# Patient Record
Sex: Female | Born: 1990 | Race: White | Hispanic: No | Marital: Single | Smoking: Never smoker
Health system: Southern US, Community
[De-identification: ages and names within clinical notes are randomized; demographics above are authoritative.]

## PROBLEM LIST (undated history)

## (undated) DIAGNOSIS — F909 Attention-deficit hyperactivity disorder, unspecified type: Secondary | ICD-10-CM

## (undated) HISTORY — PX: TONSILLECTOMY: SUR1361

## (undated) HISTORY — PX: MYRINGOTOMY: SUR874

---
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
## 2008-01-14 IMAGING — CR DG CERVICAL SPINE COMPLETE 4+V
7 series · 7 of 7 positions shown · non-contrast
Comparison: none

HISTORY: Neck and sternal pain, injured playing softball

[w c-spine lat]
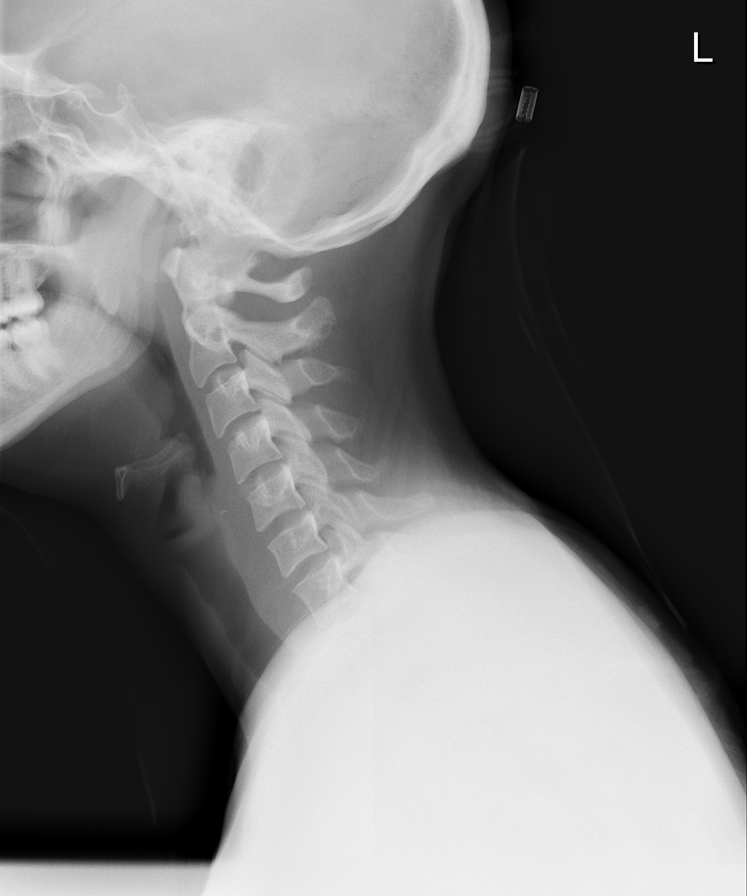

[w c-spine oblique (1 of 2)]
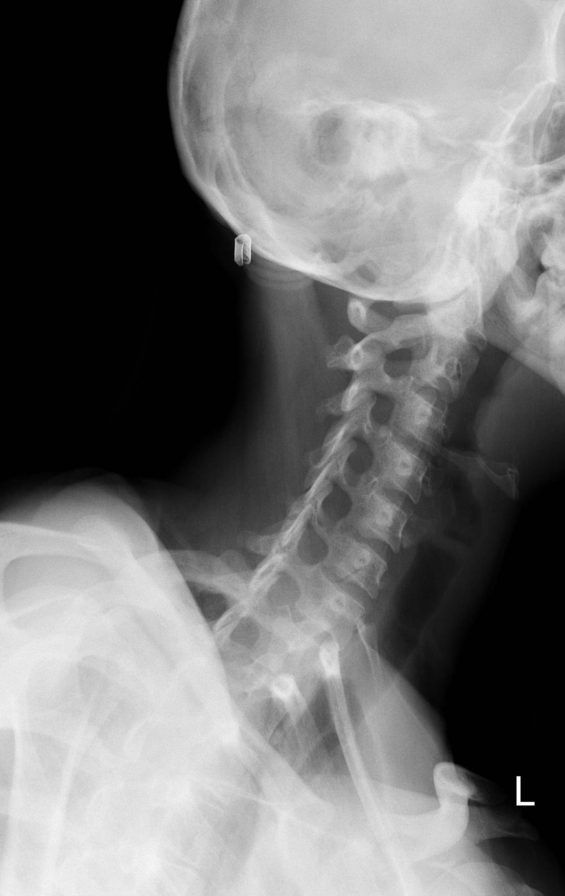

[w c-spine oblique (2 of 2)]
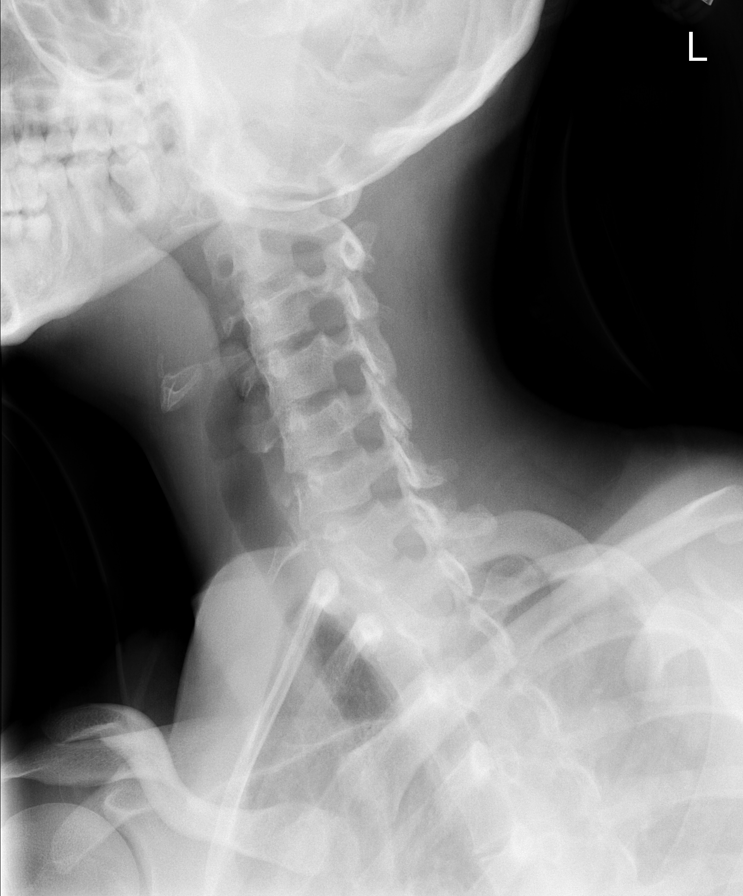

[w c-spine a.p. *]
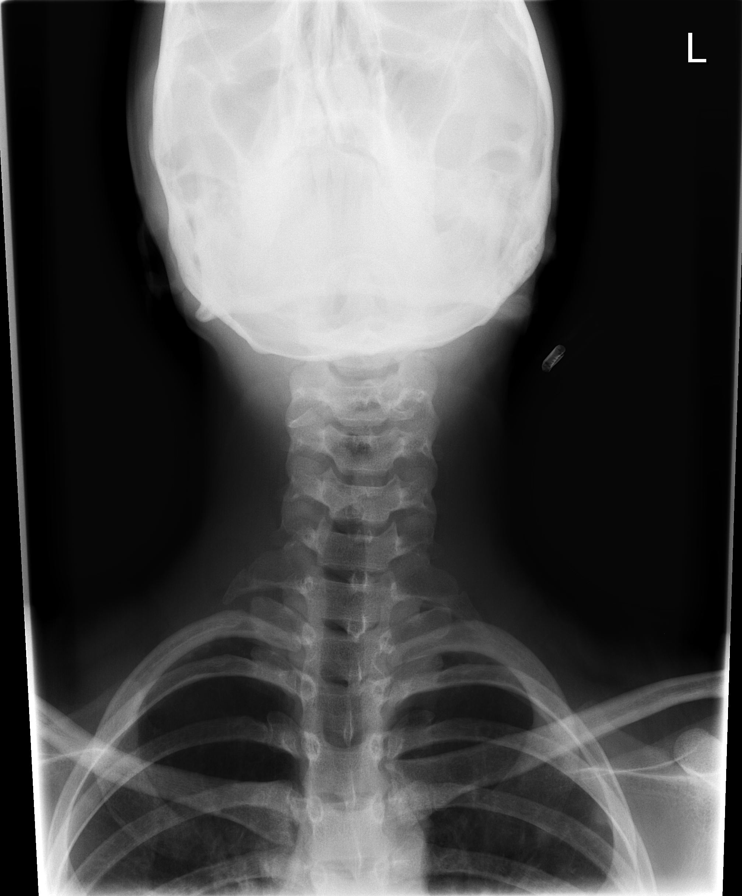

[w c-spine odontoid (1 of 2)]
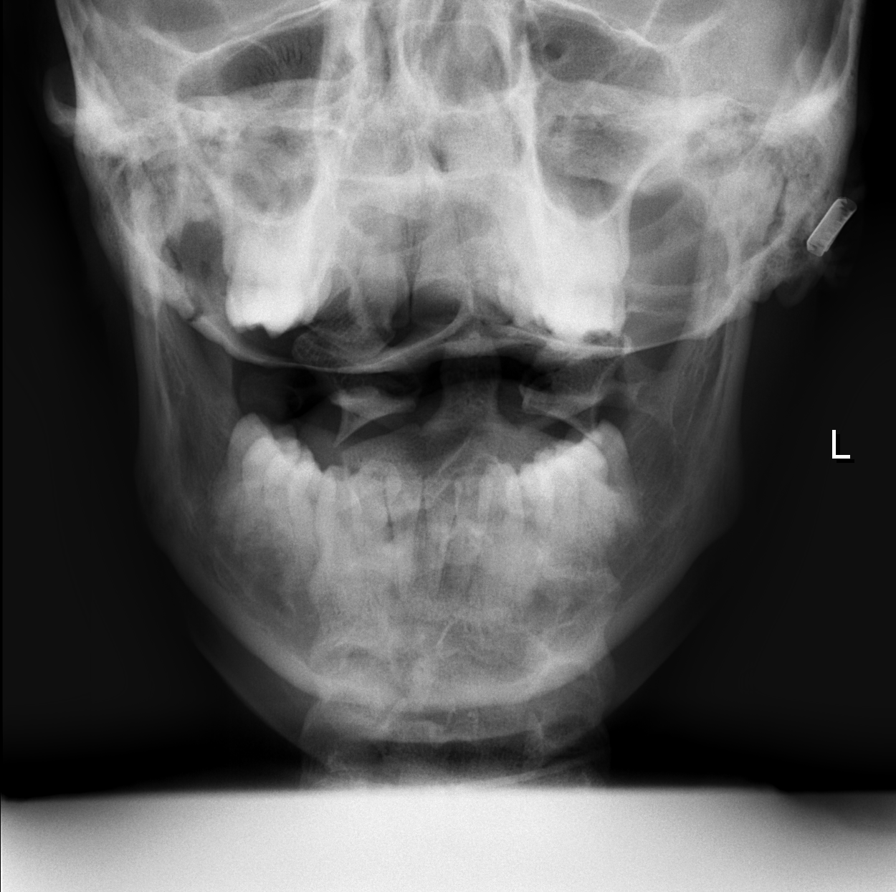

[w c-spine odontoid (2 of 2)]
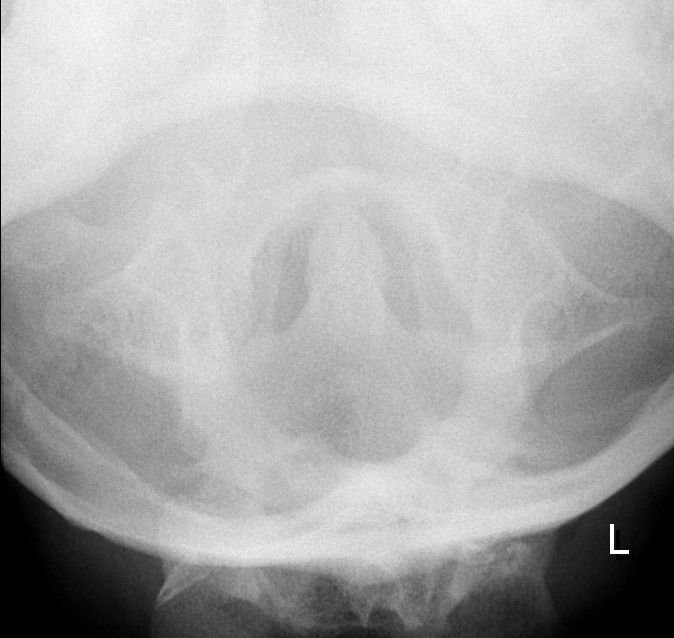

[w swimmers view]
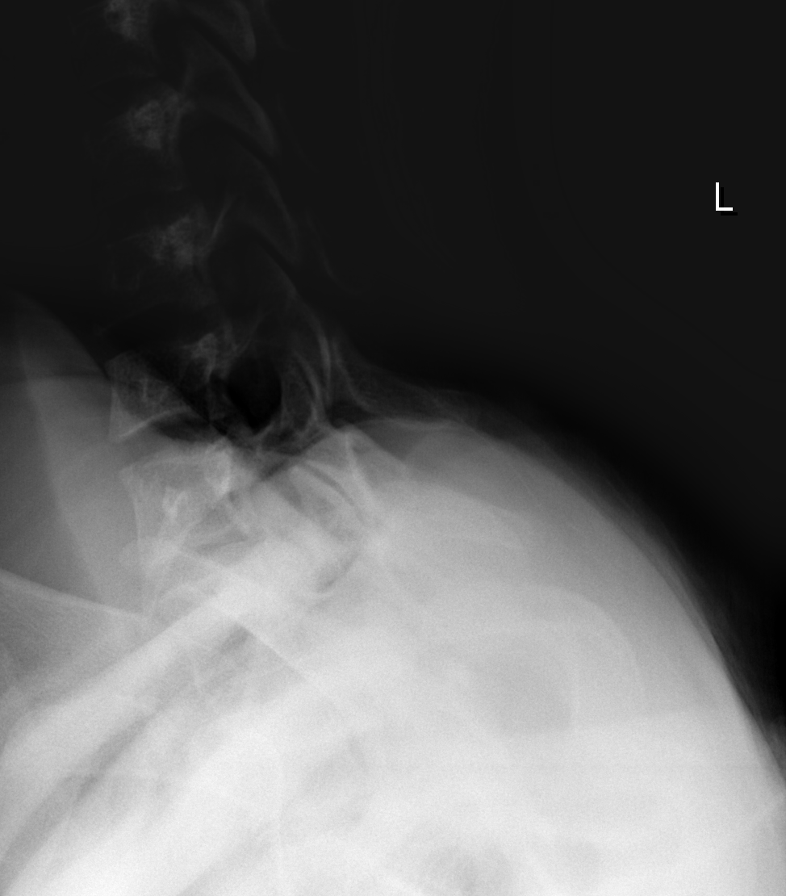

[7 of 7 positions shown; findings below may reference images not displayed]

CERVICAL SPINE 5 VIEWS:

Prominent adenoids with question respiratory motion or swallowing accentuating
nasopharyngeal soft tissue.
Cervical vertebra normal in height and alignment.
Mineralization normal.
No fracture or subluxation.
Bony foramina patent and facet alignments normal.
Slight head tilt to right.
Remain prevertebral soft tissues normal thickness.
IMPRESSION: No acute bony abnormalities.

## 2008-02-23 ENCOUNTER — Emergency Department (HOSPITAL_COMMUNITY): Admission: EM | Admit: 2008-02-23 | Discharge: 2008-02-23 | Payer: Self-pay | Admitting: Family Medicine

## 2008-07-20 IMAGING — CR DG CHEST 2V
2 series · 2 of 2 positions shown · non-contrast
Comparison: None.

CLINICAL DATA: Fever of unknown origin.

CHEST - 2 VIEW

[view not recorded (1 of 2)]
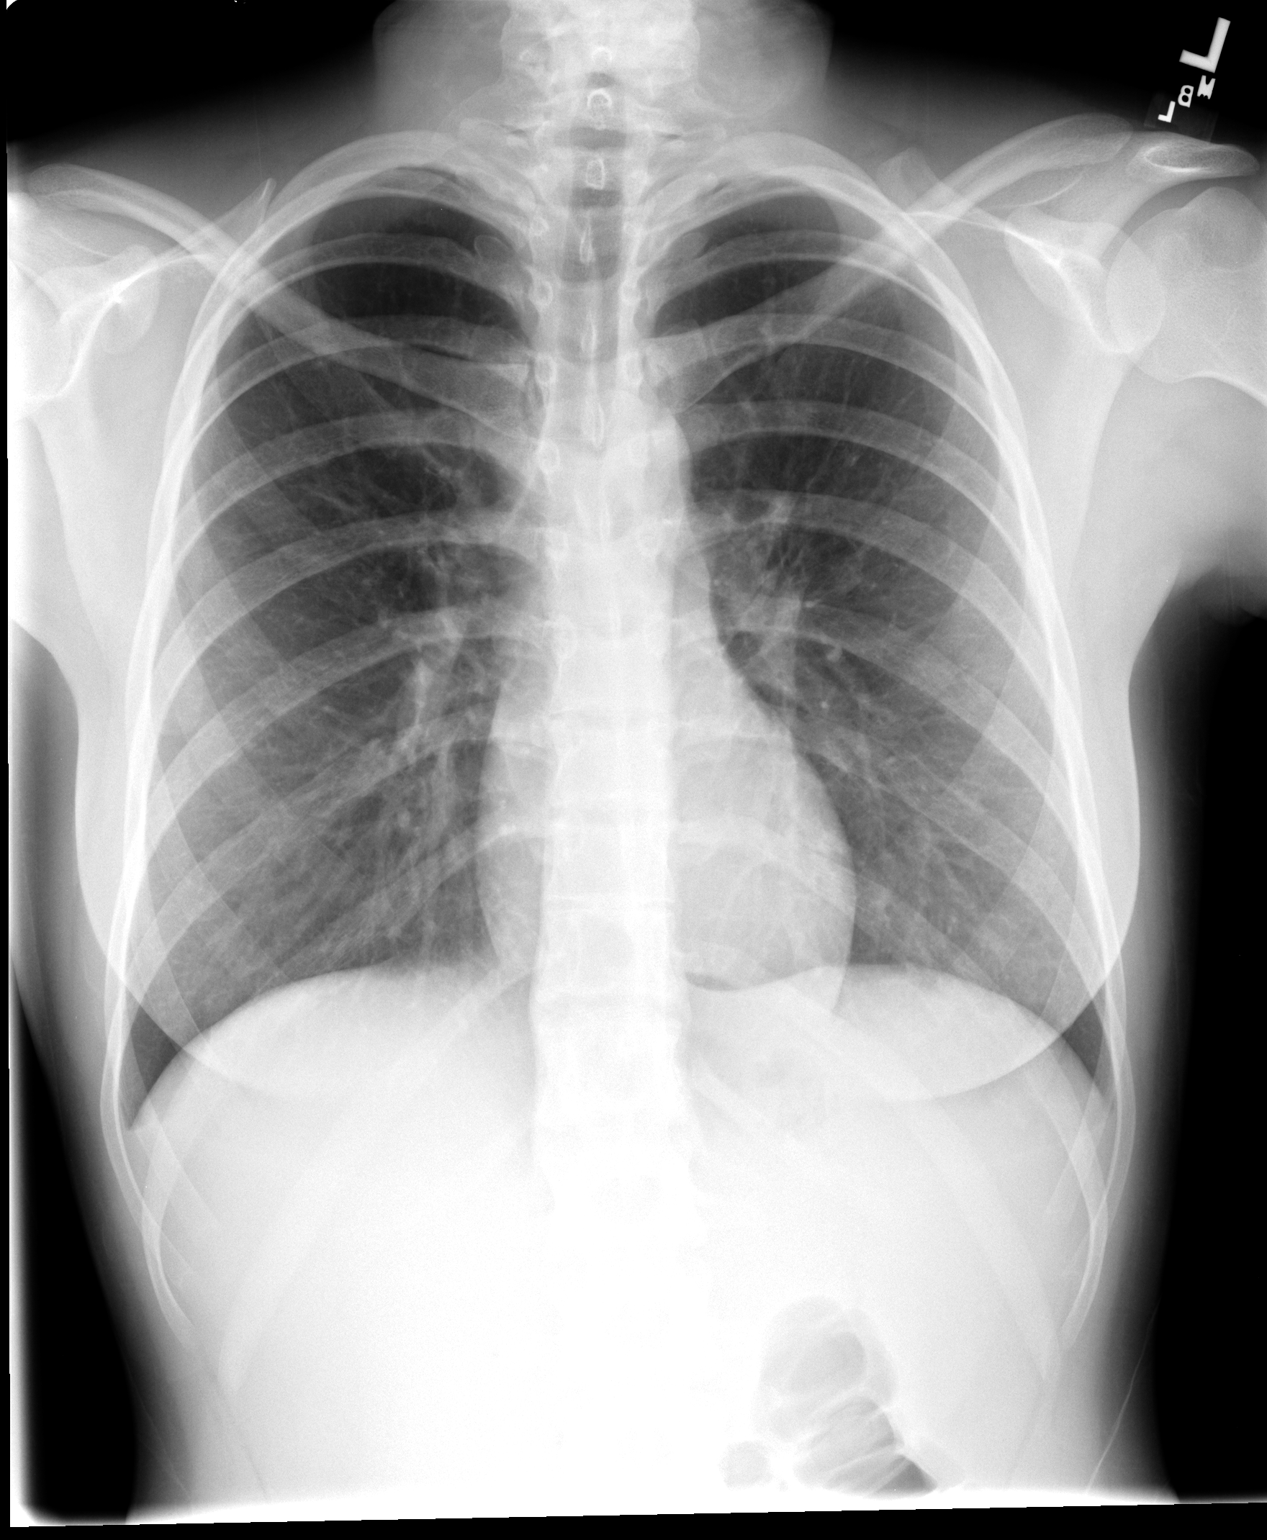

[view not recorded (2 of 2)]
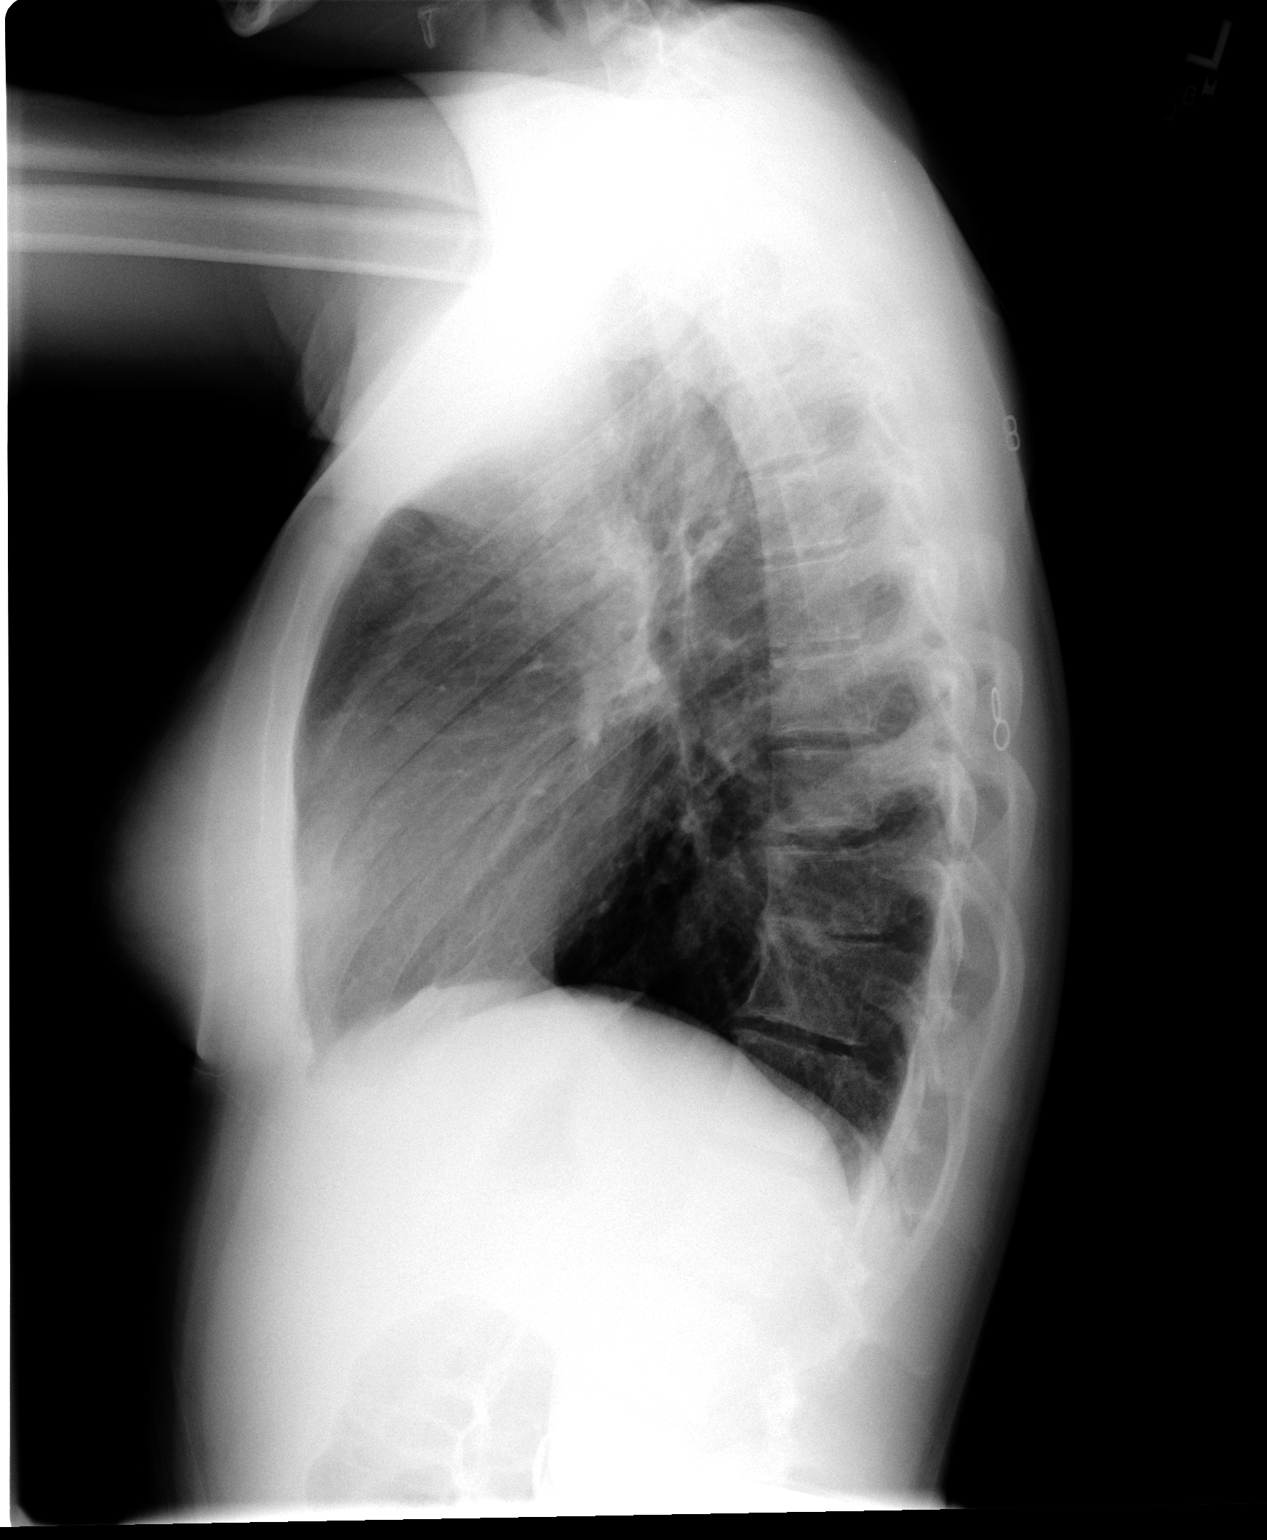

[2 of 2 positions shown; findings below may reference images not displayed]

FINDINGS: Normal sized heart. Clear lungs. Minimal scoliosis.

IMPRESSION

No acute abnormality.

## 2008-08-28 ENCOUNTER — Emergency Department (HOSPITAL_BASED_OUTPATIENT_CLINIC_OR_DEPARTMENT_OTHER): Admission: EM | Admit: 2008-08-28 | Discharge: 2008-08-28 | Payer: Self-pay | Admitting: Emergency Medicine

## 2008-08-28 ENCOUNTER — Ambulatory Visit: Payer: Self-pay | Admitting: Radiology

## 2008-09-01 ENCOUNTER — Emergency Department (HOSPITAL_BASED_OUTPATIENT_CLINIC_OR_DEPARTMENT_OTHER): Admission: EM | Admit: 2008-09-01 | Discharge: 2008-09-01 | Payer: Self-pay | Admitting: Emergency Medicine

## 2009-04-24 ENCOUNTER — Emergency Department (HOSPITAL_COMMUNITY): Admission: EM | Admit: 2009-04-24 | Discharge: 2009-04-24 | Payer: Self-pay | Admitting: Family Medicine

## 2010-02-18 ENCOUNTER — Ambulatory Visit (HOSPITAL_COMMUNITY)
Admission: RE | Admit: 2010-02-18 | Discharge: 2010-02-18 | Disposition: A | Discharge status: Home / Self Care | Payer: BC Managed Care – PPO | Source: Ambulatory Visit | Attending: Family Medicine | Admitting: Family Medicine

## 2010-02-18 ENCOUNTER — Inpatient Hospital Stay (INDEPENDENT_AMBULATORY_CARE_PROVIDER_SITE_OTHER)
Admission: RE | Admit: 2010-02-18 | Discharge: 2010-02-18 | Disposition: A | Discharge status: Home / Self Care | Payer: BC Managed Care – PPO | Source: Ambulatory Visit

## 2010-02-18 DIAGNOSIS — F411 Generalized anxiety disorder: Secondary | ICD-10-CM

## 2010-02-18 DIAGNOSIS — R079 Chest pain, unspecified: Secondary | ICD-10-CM | POA: Insufficient documentation

## 2010-02-18 LAB — DIFFERENTIAL
Basophils Relative: 0 % (ref 0–1)
Eosinophils Absolute: 0.2 10*3/uL (ref 0.0–0.7)
Eosinophils Relative: 2 % (ref 0–5)
Monocytes Absolute: 0.5 10*3/uL (ref 0.1–1.0)
Monocytes Relative: 8 % (ref 3–12)
Neutrophils Relative %: 62 % (ref 43–77)

## 2010-02-18 LAB — CBC
Hemoglobin: 12.2 g/dL (ref 12.0–15.0)
MCHC: 32.9 g/dL (ref 30.0–36.0)
MCV: 86.1 fL (ref 78.0–100.0)
Platelets: 205 10*3/uL (ref 150–400)

## 2010-02-18 LAB — POCT URINALYSIS DIPSTICK
Hgb urine dipstick: NEGATIVE
Nitrite: NEGATIVE
Urobilinogen, UA: 0.2 mg/dL (ref 0.0–1.0)
pH: 6.5 (ref 5.0–8.0)

## 2010-02-20 LAB — POCT PREGNANCY, URINE: Preg Test, Ur: NEGATIVE

## 2010-02-22 ENCOUNTER — Inpatient Hospital Stay (INDEPENDENT_AMBULATORY_CARE_PROVIDER_SITE_OTHER)
Admission: RE | Admit: 2010-02-22 | Discharge: 2010-02-22 | Disposition: A | Discharge status: Home / Self Care | Payer: BC Managed Care – PPO | Source: Ambulatory Visit | Attending: Family Medicine | Admitting: Family Medicine

## 2010-02-22 DIAGNOSIS — K5289 Other specified noninfective gastroenteritis and colitis: Secondary | ICD-10-CM

## 2010-02-23 LAB — POCT I-STAT, CHEM 8
BUN: 12 mg/dL (ref 6–23)
Chloride: 104 mEq/L (ref 96–112)
Creatinine, Ser: 0.9 mg/dL (ref 0.4–1.2)
Glucose, Bld: 88 mg/dL (ref 70–99)
Potassium: 4 mEq/L (ref 3.5–5.1)
Sodium: 139 mEq/L (ref 135–145)

## 2010-10-27 LAB — POCT URINALYSIS DIP (DEVICE)
Glucose, UA: NEGATIVE
Nitrite: NEGATIVE
Operator id: 239701
Protein, ur: 100 — AB
Urobilinogen, UA: 0.2
pH: 5.5

## 2010-10-27 LAB — POCT PREGNANCY, URINE: Preg Test, Ur: NEGATIVE

## 2010-11-02 LAB — POCT RAPID STREP A: Streptococcus, Group A Screen (Direct): POSITIVE — AB

## 2011-02-09 ENCOUNTER — Emergency Department (INDEPENDENT_AMBULATORY_CARE_PROVIDER_SITE_OTHER)
Admission: EM | Admit: 2011-02-09 | Discharge: 2011-02-09 | Disposition: A | Discharge status: Home / Self Care | Payer: BC Managed Care – PPO | Source: Home / Self Care | Attending: Emergency Medicine | Admitting: Emergency Medicine

## 2011-02-09 ENCOUNTER — Encounter (HOSPITAL_COMMUNITY): Payer: Self-pay

## 2011-02-09 DIAGNOSIS — R6889 Other general symptoms and signs: Secondary | ICD-10-CM

## 2011-02-09 MED ORDER — HYDROCODONE-ACETAMINOPHEN 7.5-500 MG/15ML PO SOLN: 5.0000 mL | Freq: Four times a day (QID) | ORAL | Status: AC | PRN

## 2011-02-09 MED ORDER — IBUPROFEN 600 MG PO TABS
600.0000 mg | ORAL_TABLET | Freq: Four times a day (QID) | ORAL | Status: AC | PRN
Start: 2011-02-09 — End: 2011-02-19

## 2011-02-09 NOTE — ED Provider Notes (Signed)
History     CSN: [id number]  Arrival date & time 02/09/11  1009   First MD Initiated Contact with Patient 02/09/11 1027      Chief Complaint  Patient presents with  . Influenza    (Consider location/radiation/quality/duration/timing/severity/associated sxs/prior treatment) HPI Comments: HPI : Flu symptoms for about 1 day. States she feels "hot ", but no documented fevers at home. Reports chills, sweats, myalgias, fatigue, headache. Symptoms are progressively worsening, despite trying OTC fever reducing medicine and rest and fluids. Has decreased appetite, but tolerating some liquids by mouth. Did not get flu shot this year. No known sick contacts.  Review of Systems: Positive for fatigue, mild nasal congestion, mild sore throat, mild swollen anterior neck glands, mild cough. Negative for acute vision changes, stiff neck, focal weakness, syncope, seizures, respiratory distress, vomiting, diarrhea, GU symptoms.   Patient is a 21 y.o. female presenting with flu symptoms.  Influenza    History reviewed. No pertinent past medical history.  Past Surgical History  Procedure Date  . Tonsillectomy   . Myringotomy     History reviewed. No pertinent family history.  History  Substance Use Topics  . Smoking status: Never Smoker   . Smokeless tobacco: Not on file  . Alcohol Use: Yes     social    OB History    Grav Para Term Preterm Abortions TAB SAB Ect Mult Living                  Review of Systems  Allergies  Review of patient's allergies indicates no known allergies.  Home Medications   Current Outpatient Rx  Name Route Sig Dispense Refill  . LORATADINE 10 MG PO TABS Oral Take 10 mg by mouth daily.    Marland Kitchen HYDROCODONE-ACETAMINOPHEN 7.5-500 MG/15ML PO SOLN Oral Take 5 mLs by mouth every 6 (six) hours as needed for pain. 120 mL 0  . IBUPROFEN 600 MG PO TABS Oral Take 1 tablet (600 mg total) by mouth every 6 (six) hours as needed for pain. 30 tablet 0    BP 120/70   Pulse 115  Temp(Src) 99.5 F (37.5 C) (Oral)  SpO2 99%  LMP 02/05/2011  Physical Exam  Nursing note and vitals reviewed. Constitutional: She is oriented to person, place, and time. She appears well-developed and well-nourished.       Appears tired  HENT:  Head: Normocephalic and atraumatic.  Right Ear: Tympanic membrane and ear canal normal.  Left Ear: Tympanic membrane and ear canal normal.  Nose: Mucosal edema and rhinorrhea present. No epistaxis.  Mouth/Throat: Uvula is midline and mucous membranes are normal. Posterior oropharyngeal erythema present. No oropharyngeal exudate.       (-) frontal, maxillary sinus tenderness  Eyes: Conjunctivae and EOM are normal. Pupils are equal, round, and reactive to light.  Neck: Normal range of motion. Neck supple.  Cardiovascular: Regular rhythm and normal heart sounds.  Tachycardia present.   Pulmonary/Chest: Effort normal and breath sounds normal. No respiratory distress. She has no wheezes. She has no rales.  Abdominal: She exhibits no distension. There is no tenderness. There is no rebound and no guarding.  Musculoskeletal: Normal range of motion.  Lymphadenopathy:    She has no cervical adenopathy.  Neurological: She is alert and oriented to person, place, and time.  Skin: Skin is warm and dry. No rash noted.  Psychiatric: She has a normal mood and affect. Her behavior is normal. Judgment and thought content normal.    ED Course  Procedures (including critical care time)  Labs Reviewed - No data to display No results found.   1. Flu-like symptoms      MDM   Pt is not pregnant / postpartum, is not obese (BMI>30), does not have diabetes, sickle cell disease, HIV infection , is receiving immunosuppressive chemotherapy for cancer,  inflammatory diseases or  marrow or organ transplants, is < 2 or > 20 y/o, has evidence of severe, complicated or progressive illness. Tamiflu not indicated.  Sending home with rest, increase fluids,  antitussives. Patient to followup with PMD if no improvement in 2 days.    Luiz Blare, MD 02/09/11 1154

## 2011-02-09 NOTE — ED Notes (Signed)
C/o onset yest PM of generalized body aches, cough, congestion; productive sounding cough

## 2012-05-22 ENCOUNTER — Encounter (HOSPITAL_COMMUNITY): Payer: Self-pay | Admitting: *Deleted

## 2012-05-22 ENCOUNTER — Emergency Department (HOSPITAL_COMMUNITY)
Admission: EM | Admit: 2012-05-22 | Discharge: 2012-05-22 | Disposition: A | Discharge status: Home / Self Care | Payer: BC Managed Care – PPO | Source: Home / Self Care | Attending: Family Medicine | Admitting: Family Medicine

## 2012-05-22 DIAGNOSIS — B0089 Other herpesviral infection: Secondary | ICD-10-CM

## 2012-05-22 MED ORDER — VALACYCLOVIR HCL 1 G PO TABS: 1000.0000 mg | ORAL_TABLET | Freq: Three times a day (TID) | ORAL | Status: DC

## 2012-05-22 NOTE — ED Notes (Signed)
Pt  Has   A  Rash  That  She  Has  Had  For  Several  Days  The  Rash is  Slightly  painfull       It is  In a  striaght  Linear  Line    Down  Side

## 2012-05-22 NOTE — ED Provider Notes (Signed)
History     CSN: [id number]  Arrival date & time 05/22/12  1807   First MD Initiated Contact with Patient 05/22/12 1853      Chief Complaint  Patient presents with  . Rash    (Consider location/radiation/quality/duration/timing/severity/associated sxs/prior treatment) Patient is a 22 y.o. female presenting with rash. The history is provided by the patient.  Rash Location:  Torso Torso rash location:  L chest and abd LUQ Quality: burning and painful   Severity:  Mild   History reviewed. No pertinent past medical history.  Past Surgical History  Procedure Laterality Date  . Tonsillectomy    . Myringotomy      No family history on file.  History  Substance Use Topics  . Smoking status: Never Smoker   . Smokeless tobacco: Not on file  . Alcohol Use: Yes     Comment: social    OB History   Grav Para Term Preterm Abortions TAB SAB Ect Mult Living                  Review of Systems  Constitutional: Negative.   Skin: Positive for rash.    Allergies  Review of patient's allergies indicates no known allergies.  Home Medications   Current Outpatient Rx  Name  Route  Sig  Dispense  Refill  . sulfamethoxazole-trimethoprim (BACTRIM DS) 800-160 MG per tablet   Oral   Take 1 tablet by mouth 2 (two) times daily.         Marland Kitchen loratadine (CLARITIN) 10 MG tablet   Oral   Take 10 mg by mouth daily.         . valACYclovir (VALTREX) 1000 MG tablet   Oral   Take 1 tablet (1,000 mg total) by mouth 3 (three) times daily.   21 tablet   0     LMP 05/19/2012  Physical Exam  Nursing note and vitals reviewed. Constitutional: She is oriented to person, place, and time. She appears well-developed and well-nourished.  Neurological: She is alert and oriented to person, place, and time.  Skin: Skin is warm and dry. Rash noted.  Patchy grouped vesicular rash erythematous basedfrom left lat chest across upper abd to umbilicus    ED Course  Procedures (including  critical care time)  Labs Reviewed  HERPES SIMPLEX VIRUS CULTURE   No results found.   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

## 2012-05-25 ENCOUNTER — Emergency Department (INDEPENDENT_AMBULATORY_CARE_PROVIDER_SITE_OTHER)
Admission: EM | Admit: 2012-05-25 | Discharge: 2012-05-25 | Disposition: A | Discharge status: Home / Self Care | Payer: BC Managed Care – PPO | Source: Home / Self Care | Attending: Emergency Medicine | Admitting: Emergency Medicine

## 2012-05-25 ENCOUNTER — Encounter (HOSPITAL_COMMUNITY): Payer: Self-pay | Admitting: *Deleted

## 2012-05-25 ENCOUNTER — Emergency Department (INDEPENDENT_AMBULATORY_CARE_PROVIDER_SITE_OTHER): Payer: BC Managed Care – PPO

## 2012-05-25 DIAGNOSIS — S6390XA Sprain of unspecified part of unspecified wrist and hand, initial encounter: Secondary | ICD-10-CM

## 2012-05-25 DIAGNOSIS — S63601A Unspecified sprain of right thumb, initial encounter: Secondary | ICD-10-CM

## 2012-05-25 MED ORDER — IBUPROFEN 600 MG PO TABS: 600.0000 mg | ORAL_TABLET | Freq: Three times a day (TID) | ORAL | Status: DC
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
## 2012-05-25 NOTE — Discharge Instructions (Signed)
Finger Sprain A finger sprain happens when the bands of tissue that hold the finger bones together (ligaments) stretch too much and tear. HOME CARE  Keep your injured finger raised (elevated) when possible.  Put ice on the injured area, twice a day, for 2 to 3 days.  Put ice in a plastic bag.  Place a towel between your skin and the bag.  Leave the ice on for 15 minutes.  Only take medicine as told by your doctor.  Do not wear rings on the injured finger.  Protect your finger until pain and stiffness go away (usually 3 to 4 weeks).  Do not get your cast or splint to get wet. Cover your cast or splint with a plastic bag when you shower or bathe. Do not swim.  Your doctor may suggest special exercises for you to do. These exercises will help keep or stop stiffness from happening. GET HELP RIGHT AWAY IF:  Your cast or splint gets damaged.  Your pain gets worse, not better. MAKE SURE YOU:  Understand these instructions.  Will watch your condition.  Will get help right away if you are not doing well or get worse. Document Released: 02/03/2010 Document Revised: 03/26/2011 Document Reviewed: 09/04/2010 [facility] Patient Information 2013 [place], [place].  Finger Sprain A finger sprain happens when the bands of tissue that hold the finger bones together (ligaments) stretch too much and tear. HOME CARE  Keep your injured finger raised (elevated) when possible.  Put ice on the injured area, twice a day, for 2 to 3 days.  Put ice in a plastic bag.  Place a towel between your skin and the bag.  Leave the ice on for 15 minutes.  Only take medicine as told by your doctor.  Do not wear rings on the injured finger.  Protect your finger until pain and stiffness go away (usually 3 to 4 weeks).  Do not get your cast or splint to get wet. Cover your cast or splint with a plastic bag when you shower or bathe. Do not swim.  Your doctor may suggest special exercises for you to do.  These exercises will help keep or stop stiffness from happening. GET HELP RIGHT AWAY IF:  Your cast or splint gets damaged.  Your pain gets worse, not better. MAKE SURE YOU:  Understand these instructions.  Will watch your condition.  Will get help right away if you are not doing well or get worse. Document Released: 02/03/2010 Document Revised: 03/26/2011 Document Reviewed: 09/04/2010 [facility] Patient Information 2013 [place], [place].

## 2012-06-05 ENCOUNTER — Encounter (HOSPITAL_COMMUNITY): Payer: Self-pay | Admitting: Emergency Medicine

## 2012-06-05 ENCOUNTER — Emergency Department (HOSPITAL_COMMUNITY)
Admission: EM | Admit: 2012-06-05 | Discharge: 2012-06-05 | Disposition: A | Discharge status: Home / Self Care | Payer: BC Managed Care – PPO | Source: Home / Self Care | Attending: Emergency Medicine | Admitting: Emergency Medicine

## 2012-06-05 DIAGNOSIS — J309 Allergic rhinitis, unspecified: Secondary | ICD-10-CM

## 2012-06-05 DIAGNOSIS — J310 Chronic rhinitis: Secondary | ICD-10-CM

## 2012-06-05 DIAGNOSIS — J302 Other seasonal allergic rhinitis: Secondary | ICD-10-CM

## 2012-06-05 DIAGNOSIS — J0100 Acute maxillary sinusitis, unspecified: Secondary | ICD-10-CM

## 2012-06-05 MED ORDER — FEXOFENADINE-PSEUDOEPHED ER 60-120 MG PO TB12: 1.0000 | ORAL_TABLET | Freq: Two times a day (BID) | ORAL | Status: DC

## 2012-06-05 MED ORDER — FLUTICASONE PROPIONATE 50 MCG/ACT NA SUSP: 2.0000 | Freq: Every day | NASAL | Status: DC

## 2012-06-05 MED ORDER — AMOXICILLIN 500 MG PO CAPS: 500.0000 mg | ORAL_CAPSULE | Freq: Three times a day (TID) | ORAL | Status: AC

## 2012-06-05 NOTE — ED Notes (Signed)
Pt c/o sinus pressure x 3 weeks. Get frequent headaches with pressure around eyes. Has nasal congestion. Has used nasal spray, saline, and tylenol cold and sinus with no relief of symptoms. No fever, cough, or n/v/d. Pt is alert and oriented.

## 2012-06-05 NOTE — ED Provider Notes (Signed)
History     CSN: [id number]  Arrival date & time 06/05/12  1015   First MD Initiated Contact with Patient 06/05/12 1025      Chief Complaint  Patient presents with  . Sinusitis    (Consider location/radiation/quality/duration/timing/severity/associated sxs/prior treatment) HPI Comments: Patient presents urgent care describing for the last 3 weeks she's been having sinus pressure points to both forehead and maxillary sinus. Frequent headaches and pressure on her eyes with a constant nasal congestion and runny nose she's been using nasal spray saline and Tylenol Cold over-the-counter for her symptoms with no relief. Denies any fevers cough or shortness of breath. She describes since she came to study here from [place] psychological things have happened to her including this allergies.  Patient is a 22 y.o. female presenting with sinusitis. The history is provided by the patient.  Sinusitis Pain details:    Location:  Frontal and maxillary   Quality:  Pressure   Severity:  Moderate   Duration:  3 weeks   Timing:  Constant Progression:  Worsening Relieved by:  Nothing Worsened by:  Nothing tried Ineffective treatments:  Saline sprays Associated symptoms: congestion, cough, headaches, rhinorrhea, sore throat and wheezing   Associated symptoms: no chest pain, no chills, no ear pain, no mouth breathing, no shortness of breath, no swollen glands and no vertigo   Risk factors: no allergic reaction     History reviewed. No pertinent past medical history.  Past Surgical History  Procedure Laterality Date  . Tonsillectomy    . Myringotomy      No family history on file.  History  Substance Use Topics  . Smoking status: Never Smoker   . Smokeless tobacco: Not on file  . Alcohol Use: Yes     Comment: social    OB History   Grav Para Term Preterm Abortions TAB SAB Ect Mult Living                  Review of Systems  Constitutional: Negative for chills and activity  change.  HENT: Positive for congestion, sore throat, rhinorrhea, postnasal drip and sinus pressure. Negative for ear pain, mouth sores, neck pain and neck stiffness.   Eyes: Negative for photophobia, redness and visual disturbance.  Respiratory: Positive for cough and wheezing. Negative for shortness of breath.   Cardiovascular: Negative for chest pain.  Musculoskeletal: Negative for back pain, joint swelling, arthralgias and gait problem.  Neurological: Positive for headaches. Negative for vertigo.    Allergies  Review of patient's allergies indicates no known allergies.  Home Medications   Current Outpatient Rx  Name  Route  Sig  Dispense  Refill  . sulfamethoxazole-trimethoprim (BACTRIM DS) 800-160 MG per tablet   Oral   Take 1 tablet by mouth 2 (two) times daily.         Marland Kitchen ibuprofen (ADVIL,MOTRIN) 600 MG tablet   Oral   Take 1 tablet (600 mg total) by mouth 3 (three) times daily after meals.   30 tablet   0   . loratadine (CLARITIN) 10 MG tablet   Oral   Take 10 mg by mouth daily.         . valACYclovir (VALTREX) 1000 MG tablet   Oral   Take 1 tablet (1,000 mg total) by mouth 3 (three) times daily.   21 tablet   0     BP 120/75  Pulse 78  Temp(Src) 99.2 F (37.3 C) (Oral)  Resp 21  SpO2 99%  LMP 05/13/2012  Physical Exam  Nursing note and vitals reviewed. Constitutional: Vital signs are normal. She appears well-developed and well-nourished.  Non-toxic appearance. She does not have a sickly appearance. She does not appear ill. No distress.  HENT:  Head: Normocephalic.  Right Ear: Tympanic membrane normal.  Left Ear: Tympanic membrane normal.  Nose: Nose normal.  Mouth/Throat: Uvula is midline and mucous membranes are normal. Mucous membranes are not pale. Posterior oropharyngeal erythema present. No oropharyngeal exudate, posterior oropharyngeal edema or tonsillar abscesses.    Eyes: Conjunctivae are normal. Right eye exhibits no discharge. Left eye  exhibits no discharge. No scleral icterus.  Neck: Neck supple. No JVD present.  Pulmonary/Chest: Effort normal and breath sounds normal. She has no decreased breath sounds. She has no wheezes. She has no rhonchi. She has no rales.  Abdominal: Soft. Normal appearance. There is no tenderness.  Musculoskeletal: She exhibits no tenderness.  Neurological: She is alert.  Skin: No rash noted. No erythema.    ED Course  Procedures (including critical care time)  Labs Reviewed - No data to display No results found.   No diagnosis found.    MDM  Maxillary and frontal sinusitis most likely induced by environmental allergies. Have described patient and a course of Allegra-D for [redacted] weeks along with Flonase. If no improvement in the next 3 days patient was instructed to start with antibiotics (AMOXICILLIN). She agrees with treatment plan and followup care as necessary.        Jimmie Molly, MD 06/05/12 1048

## 2012-08-08 ENCOUNTER — Encounter (HOSPITAL_COMMUNITY): Payer: Self-pay | Admitting: Emergency Medicine

## 2012-08-08 ENCOUNTER — Emergency Department (HOSPITAL_COMMUNITY)
Admission: EM | Admit: 2012-08-08 | Discharge: 2012-08-08 | Disposition: A | Discharge status: Home / Self Care | Payer: BC Managed Care – PPO | Source: Home / Self Care | Attending: Emergency Medicine | Admitting: Emergency Medicine

## 2012-08-08 DIAGNOSIS — J309 Allergic rhinitis, unspecified: Secondary | ICD-10-CM

## 2012-08-08 DIAGNOSIS — J019 Acute sinusitis, unspecified: Secondary | ICD-10-CM

## 2012-08-08 MED ORDER — PREDNISONE 20 MG PO TABS: 20.0000 mg | ORAL_TABLET | Freq: Two times a day (BID) | ORAL | Status: DC

## 2012-08-08 MED ORDER — FLUTICASONE PROPIONATE 50 MCG/ACT NA SUSP: 2.0000 | Freq: Every day | NASAL | Status: DC

## 2012-08-08 MED ORDER — AMOXICILLIN-POT CLAVULANATE 875-125 MG PO TABS: 1.0000 | ORAL_TABLET | Freq: Two times a day (BID) | ORAL | Status: DC

## 2012-08-08 MED ORDER — FLUCONAZOLE 150 MG PO TABS: 150.0000 mg | ORAL_TABLET | Freq: Once | ORAL | Status: DC

## 2012-08-08 NOTE — ED Notes (Signed)
Reports "sinus infection" c/o facial pressure, stuffy nose, throat drainage, and both ears hurting.  Patient has difficulty sleeping secondary to being so uncomfortable.

## 2012-08-08 NOTE — ED Provider Notes (Signed)
Chief Complaint:   Chief Complaint  Patient presents with  . 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 She's had postnasal drainage and hoarseness, dry cough, and nausea. She had the same thing in May which was diagnosed as sinusitis was treated with amoxicillin. She has allergies and takes Allegra-D on an as-needed basis. She also has hidradenitis suppurativa, involving mostly her groin area, and she takes Bactrim twice daily which he is to be on for a year  Review of Systems:  Other than noted above, the patient denies any of the following symptoms: Systemic:  No fevers, chills, sweats, weight loss or gain, fatigue, or tiredness. Eye:  No redness or discharge. ENT:  No ear pain, drainage, headache, nasal congestion, drainage, sinus pressure, difficulty swallowing, or sore throat. Neck:  No neck pain or swollen glands. Lungs:  No cough, sputum production, hemoptysis, wheezing, chest tightness, shortness of breath or chest pain. GI:  No abdominal pain, nausea, vomiting or diarrhea.  PMFSH:  Past medical history, family history, social history, meds, and allergies were reviewed.   Physical Exam:   Vital signs:  BP 125/79  Pulse 88  Temp(Src) 98.1 F (36.7 C) (Oral)  Resp 20  SpO2 99% General:  Alert and oriented.  In no distress.  Skin warm and dry. Eye:  No conjunctival injection or drainage. Lids were normal. ENT:  TMs and canals were normal, without erythema or inflammation.  Nasal mucosa was congested with yellow drainage.  Mucous membranes were moist.  Pharynx was clear with no exudate or drainage.  There were no oral ulcerations or lesions. Neck:  Supple, no adenopathy, tenderness or mass. Lungs:  No respiratory distress.  Lungs were clear to  auscultation, without wheezes, rales or rhonchi.  Breath sounds were clear and equal bilaterally.  Heart:  Regular rhythm, without gallops, murmers or rubs. Skin:  Clear, warm, and dry, without rash or lesions.  Assessment:  The primary encounter diagnosis was Acute sinusitis. A diagnosis of Allergic rhinitis was also pertinent to this visit.  Allergic rhinitis is probably the underlying fracture for her frequent acute sinusitis.  Plan:   1.  The following meds were prescribed:   New Prescriptions   AMOXICILLIN-CLAVULANATE (AUGMENTIN) 875-125 MG PER TABLET    Take 1 tablet by mouth 2 (two) times daily.   FLUCONAZOLE (DIFLUCAN) 150 MG TABLET    Take 1 tablet (150 mg total) by mouth once.   FLUTICASONE (FLONASE) 50 MCG/ACT NASAL SPRAY    Place 2 sprays into the nose daily.   PREDNISONE (DELTASONE) 20 MG TABLET    Take 1 tablet (20 mg total) by mouth 2 (two) times daily.   2.  The patient was instructed in symptomatic care and handouts were given. 3.  The patient was told to return if becoming worse in any way, if no better in 3 or 4 days, and given some red flag symptoms such as worsening pain or increasing fever that would indicate earlier return. 4.  Follow up here if necessary.      Reuben Likes, MD 08/08/12 [phone]

## 2016-12-07 ENCOUNTER — Ambulatory Visit (HOSPITAL_COMMUNITY)
Admission: EM | Admit: 2016-12-07 | Discharge: 2016-12-07 | Disposition: A | Discharge status: Home / Self Care | Payer: Self-pay | Attending: Internal Medicine | Admitting: Internal Medicine

## 2016-12-07 ENCOUNTER — Encounter (HOSPITAL_COMMUNITY): Payer: Self-pay | Admitting: Emergency Medicine

## 2016-12-07 DIAGNOSIS — B349 Viral infection, unspecified: Secondary | ICD-10-CM

## 2016-12-07 DIAGNOSIS — H9201 Otalgia, right ear: Secondary | ICD-10-CM

## 2016-12-07 DIAGNOSIS — J029 Acute pharyngitis, unspecified: Secondary | ICD-10-CM

## 2016-12-07 LAB — POCT RAPID STREP A: STREPTOCOCCUS, GROUP A SCREEN (DIRECT): NEGATIVE

## 2016-12-07 MED ORDER — FEXOFENADINE-PSEUDOEPHED ER 60-120 MG PO TB12: 1.0000 | ORAL_TABLET | Freq: Two times a day (BID) | ORAL | 0 refills | Status: DC

## 2016-12-07 MED ORDER — IPRATROPIUM BROMIDE 0.06 % NA SOLN: 2.0000 | Freq: Four times a day (QID) | NASAL | 0 refills | Status: AC

## 2016-12-07 NOTE — Discharge Instructions (Signed)
Rapid strep negative. Symptoms are most likely due to viral illness. Flonase and/or Allegra-D for nasal congestion. You can also add atrovent nasal spray to help with symptoms. You can use over the counter nasal saline rinse such as neti pot for nasal congestion. Monitor for any worsening of symptoms, swelling of the throat, trouble breathing, trouble swallowing, follow up for reevaluation.   For sore throat try using a honey-based tea. Use 3 teaspoons of honey with juice squeezed from half lemon. Place shaved pieces of ginger into 1/2-1 cup of water and warm over stove top. Then mix the ingredients and repeat every 4 hours as needed.

## 2016-12-07 NOTE — ED Provider Notes (Signed)
[facility]    CSN: [id number] Arrival date & time: 12/07/16  1012     History   Chief Complaint Chief Complaint  Patient presents with  . Sore Throat  . Otalgia    HPI Kaylee Mcclure is a 26 y.o. female.   26 year old female comes in with 2 day history of sore throat and right ear pain. Also having nasal congestion, sneezing, rhinorrhea. Denies cough, fever, chills, night sweats. [place] on the flight. Has not tried anything for the symptoms. Never smoker.       History reviewed. No pertinent past medical history.  There are no active problems to display for this patient.   Past Surgical History:  Procedure Laterality Date  . MYRINGOTOMY    . TONSILLECTOMY      OB History    No data available       Home Medications    Prior to Admission medications   Medication Sig Start Date End Date Taking? Authorizing Provider  fexofenadine-pseudoephedrine (ALLEGRA-D) 60-120 MG 12 hr tablet Take 1 tablet by mouth every 12 (twelve) hours. 12/07/16   Cathie HoopsYu, Garris Melhorn V, PA-C  fluticasone (FLONASE) 50 MCG/ACT nasal spray Place 2 sprays into the nose daily. 08/08/12   Reuben LikesKeller, David C, MD  ibuprofen (ADVIL,MOTRIN) 600 MG tablet Take 1 tablet (600 mg total) by mouth 3 (three) times daily after meals. 05/25/12   de [place], Duwayne Heckeuben M, MD  ipratropium (ATROVENT) 0.06 % nasal spray Place 2 sprays into both nostrils 4 (four) times daily. 12/07/16   Belinda FisherYu, Consuelo Suthers V, PA-C    Family History History reviewed. No pertinent family history.  Social History Social History   Tobacco Use  . Smoking status: Never Smoker  Substance Use Topics  . Alcohol use: Yes    Comment: social  . Drug use: Not on file     Allergies   Patient has no known allergies.   Review of Systems Review of Systems  Reason unable to perform ROS: See HPI as above.     Physical Exam Triage Vital Signs ED Triage Vitals [12/07/16 1033]  Enc  Vitals Group     BP 134/80     Pulse Rate 87     Resp 18     Temp 98.2 F (36.8 C)     Temp Source Oral     SpO2 100 %     Weight      Height      Head Circumference      Peak Flow      Pain Score 5     Pain Loc      Pain Edu?      Excl. in GC?    No data found.  Updated Vital Signs BP 134/80 (BP Location: Right Arm)   Pulse 87   Temp 98.2 F (36.8 C) (Oral)   Resp 18   SpO2 100%   Physical Exam  Constitutional: She is oriented to person, place, and time. She appears well-developed and well-nourished. No distress.  HENT:  Head: Normocephalic and atraumatic.  Right Ear: Tympanic membrane, external ear and ear canal normal. Tympanic membrane is not erythematous and not bulging.  Left Ear: Tympanic membrane, external ear and ear canal normal. Tympanic membrane is not erythematous and not bulging.  Nose: Mucosal edema and rhinorrhea present. Right sinus exhibits maxillary sinus tenderness. Right sinus exhibits no frontal sinus tenderness. Left sinus exhibits maxillary  sinus tenderness. Left sinus exhibits no frontal sinus tenderness.  Mouth/Throat: Uvula is midline, oropharynx is clear and moist and mucous membranes are normal.  Eyes: Conjunctivae are normal. Pupils are equal, round, and reactive to light.  Neck: Normal range of motion. Neck supple.  Cardiovascular: Normal rate, regular rhythm and normal heart sounds. Exam reveals no gallop and no friction rub.  No murmur heard. Pulmonary/Chest: Effort normal and breath sounds normal. She has no decreased breath sounds. She has no wheezes. She has no rhonchi. She has no rales.  Lymphadenopathy:    She has no cervical adenopathy.  Neurological: She is alert and oriented to person, place, and time.  Skin: Skin is warm and dry.  Psychiatric: She has a normal mood and affect. Her behavior is normal. Judgment normal.     UC Treatments / Results  Labs (all labs ordered are listed, but only abnormal results are displayed) Labs  Reviewed  CULTURE, GROUP A STREP [facility])  POCT RAPID STREP A    EKG  EKG Interpretation None       Radiology No results found.  Procedures Procedures (including critical care time)  Medications Ordered in UC Medications - No data to display   Initial Impression / Assessment and Plan / UC Course  I have reviewed the triage vital signs and the nursing notes.  Pertinent labs & imaging results that were available during my care of the patient were reviewed by me and considered in my medical decision making (see chart for details).    Discussed symptoms could be due to viral URI, allergies, as well as recent altitude change. Symptomatic treatment as needed. Push fluids. Return precautions given.    Final Clinical Impressions(s) / UC Diagnoses   Final diagnoses:  Viral illness    ED Discharge Orders        Ordered    fexofenadine-pseudoephedrine (ALLEGRA-D) 60-120 MG 12 hr tablet  Every 12 hours     12/07/16 1106    ipratropium (ATROVENT) 0.06 % nasal spray  4 times daily     12/07/16 1106        [address]Ayonna Speranza V, [place]PA-C 12/07/16 1122

## 2016-12-07 NOTE — ED Triage Notes (Signed)
Pt sts sore throat x 2 days and right ear pain

## 2016-12-09 LAB — CULTURE, GROUP A STREP (THRC)

## 2018-01-29 ENCOUNTER — Encounter (HOSPITAL_COMMUNITY): Payer: Self-pay

## 2018-01-29 ENCOUNTER — Ambulatory Visit (HOSPITAL_COMMUNITY)
Admission: EM | Admit: 2018-01-29 | Discharge: 2018-01-29 | Disposition: A | Discharge status: Home / Self Care | Payer: Self-pay | Attending: Family Medicine | Admitting: Family Medicine

## 2018-01-29 DIAGNOSIS — J019 Acute sinusitis, unspecified: Secondary | ICD-10-CM

## 2018-01-29 MED ORDER — AMOXICILLIN-POT CLAVULANATE 875-125 MG PO TABS: 1.0000 | ORAL_TABLET | Freq: Two times a day (BID) | ORAL | 0 refills | Status: AC

## 2018-01-29 NOTE — ED Provider Notes (Signed)
[facility]   [id number] 01/29/18 Arrival Time: 1403   CC: URI symptoms   SUBJECTIVE: History from: patient.  Patterson Hammersmithshley R Salay is a 28 y.o. female who presents with abrupt onset of nasal congestion, runny nose, sinus pain/ pressure, PND, and productive cough x 1 week.  Denies positive sick exposure or precipitating event, but does [place]  Has tried OTC medications without relief.  Symptoms are made worse at night.  Reports previous symptoms in the past.   Complains of associated fatigue.  Denies fever, chills, SOB, wheezing, chest pain, nausea, changes in bowel or bladder habits.    Received flu shot this year: no.  ROS: As per HPI.  History reviewed. No pertinent past medical history. Past Surgical History:  Procedure Laterality Date  . MYRINGOTOMY    . TONSILLECTOMY     No Known Allergies No current facility-administered medications on file prior to encounter.    Current Outpatient Medications on File Prior to Encounter  Medication Sig Dispense Refill  . fexofenadine-pseudoephedrine (ALLEGRA-D) 60-120 MG 12 hr tablet Take 1 tablet by mouth every 12 (twelve) hours. 60 tablet 0  . fluticasone (FLONASE) 50 MCG/ACT nasal spray Place 2 sprays into the nose daily. 16 g 0  . ibuprofen (ADVIL,MOTRIN) 600 MG tablet Take 1 tablet (600 mg total) by mouth 3 (three) times daily after meals. 30 tablet 0  . ipratropium (ATROVENT) 0.06 % nasal spray Place 2 sprays into both nostrils 4 (four) times daily. 15 mL 0   Social History   Socioeconomic History  . Marital status: Single    Spouse name: Not on file  . Number of children: Not on file  . Years of education: Not on file  . Highest education level: Not on file  Occupational History  . Not on file  Social Needs  . Financial resource strain: Not on file  . Food insecurity:    Worry: Not on file    Inability: Not on file  . Transportation needs:    Medical: Not on file    Non-medical: Not  on file  Tobacco Use  . Smoking status: Never Smoker  Substance and Sexual Activity  . Alcohol use: Yes    Comment: social  . Drug use: Not on file  . Sexual activity: Not on file  Lifestyle  . Physical activity:    Days per week: Not on file    Minutes per session: Not on file  . Stress: Not on file  Relationships  . Social connections:    Talks on phone: Not on file    Gets together: Not on file    Attends religious service: Not on file    Active member of club or organization: Not on file    Attends meetings of clubs or organizations: Not on file    Relationship status: Not on file  . Intimate partner violence:    Fear of current or ex partner: Not on file    Emotionally abused: Not on file    Physically abused: Not on file    Forced sexual activity: Not on file  Other Topics Concern  . Not on file  Social History Narrative  . Not on file   Family History  Problem Relation Age of Onset  . Healthy Mother   . Healthy Father     OBJECTIVE:  Vitals:   01/29/18 1450  BP: 119/78  Pulse: 87  Resp: 20  Temp: 99.9 F (37.7  C)  TempSrc: Oral  SpO2: 100%     General appearance: alert; appears fatigued, but nontoxic; speaking in full sentences and tolerating own secretions HEENT: NCAT; Ears: EACs clear, TMs pearly gray; Eyes: PERRL.  EOM grossly intact. Sinuses: mild TTP; Nose: nares patent with clear rhinorrhea, Throat: oropharynx clear, tonsils non erythematous or enlarged, uvula midline  Neck: supple without LAD Lungs: unlabored respirations, symmetrical air entry; cough: mild; no respiratory distress; CTAB Heart: regular rate and rhythm.  Radial pulses 2+ symmetrical bilaterally Skin: warm and dry Psychological: alert and cooperative; normal mood and affect  ASSESSMENT & PLAN:  1. Acute non-recurrent sinusitis, unspecified location     Meds ordered this encounter  Medications  . amoxicillin-clavulanate (AUGMENTIN) 875-125 MG tablet    Sig: Take 1 tablet by  mouth every 12 (twelve) hours for 10 days.    Dispense:  20 tablet    Refill:  0    Order Specific Question:   Supervising Provider    Answer:   Eustace Moore [[id number]]   Rest and push fluids Continue with allegra D and flonase.  Use daily for symptomatic relief Augmentin prescribed.  Take as directed and to completion Continue with OTC ibuprofen/tylenol as needed for pain Follow up with PCP if symptoms persists Return or go to the ED if you have any new or worsening symptoms such as fever, chills, worsening sinus pain/pressure, cough, sore throat, chest pain, shortness of breath, abdominal pain, changes in bowel or bladder habits, etc...   Reviewed expectations re: course of current medical issues. Questions answered. Outlined signs and symptoms indicating need for more acute intervention. Patient verbalized understanding. After Visit Summary given.         Rennis Harding, PA-C 01/29/18 1527

## 2018-01-29 NOTE — ED Triage Notes (Signed)
Pt presents with ongoing cough for over a week with yellow & green mucus ; patient believes it may be a secondary infection from her viral bronchitis from 3 weeks ago.

## 2018-01-29 NOTE — Discharge Instructions (Addendum)
Rest and push fluids Continue with allegra D and flonase.  Use daily for symptomatic relief Augmentin prescribed.  Take as directed and to completion Continue with OTC ibuprofen/tylenol as needed for pain Follow up with PCP if symptoms persists Return or go to the ED if you have any new or worsening symptoms such as fever, chills, worsening sinus pain/pressure, cough, sore throat, chest pain, shortness of breath, abdominal pain, changes in bowel or bladder habits, etc...   You may take 0.1 to 0.3 mg of melatonin at bedtime to help with sleep

## 2019-02-13 ENCOUNTER — Ambulatory Visit: Payer: Self-pay | Attending: Internal Medicine

## 2019-04-14 ENCOUNTER — Telehealth: Payer: Self-pay | Admitting: Dermatology

## 2019-04-14 ENCOUNTER — Other Ambulatory Visit: Payer: Self-pay | Admitting: *Deleted

## 2019-04-14 MED ORDER — HYDROQUINONE 4 % EX CREA: TOPICAL_CREAM | Freq: Two times a day (BID) | CUTANEOUS | 2 refills | Status: DC

## 2019-04-14 NOTE — Telephone Encounter (Signed)
PHONE CALL FROM PATIENT WANTING REFILL ON HYDROQUINONE 4% OK WITH 2 REFILLS.

## 2019-04-14 NOTE — Telephone Encounter (Signed)
Kaylee Mcclure is a patient of [name] and has used Ambi to bleach rash area.  She said that [employer] wanted her to use Ambi to see if she was allergic to it.  She had no reaction to it and says that JCB was to call in RX-Hydroquinone 4 % if no reaction.  She uses [facility] [address]

## 2019-07-15 ENCOUNTER — Telehealth: Payer: Self-pay

## 2019-07-15 MED ORDER — SULFAMETHOXAZOLE-TRIMETHOPRIM 800-160 MG PO TABS: 1.0000 | ORAL_TABLET | Freq: Two times a day (BID) | ORAL | 3 refills | Status: AC

## 2019-07-15 NOTE — Telephone Encounter (Signed)
Phone call to patient to inform her that [name], PA-C is okay with sending in Bactrim DS for her HS (Hidradenitis Suppurativa) flares.  Patient aware.

## 2019-07-15 NOTE — Telephone Encounter (Signed)
-----   Message from [name], [place] sent at 07/14/2019 11:30 AM EDT ----- Regarding: RE: Medication Request After reviewing paper chart by phone with [name] she has been given bactrim DS by [facility] for her HS flares and this has worked well for her. We will give her Bactrim DS #60. Call if any problems.Warn of risk of allergy even though she has taken it before. ----- Message ----- From: [name], CMA Sent: 07/13/2019   9:28 AM EDT To: [name], PA-C Subject: Medication Request                             Phone call from patient stating that you informed her to give the office a call whenever she had an Hidradenitis Suppurativa(HS) flare up so you could send in a Prescription for her.  Patient states that she's currently having a flare up and she would like to have something called into her Pharmacy for this.  Patient uses [facility] [address] [place].

## 2019-11-27 ENCOUNTER — Telehealth: Payer: Self-pay | Admitting: Dermatology

## 2019-11-27 NOTE — Telephone Encounter (Signed)
See patient's note.

## 2019-11-27 NOTE — Telephone Encounter (Signed)
Patient calling to say that she had started Bactrim for hidradenitis suppurativa a couple of months ago and developed a yeast infection (her first ever), so she stopped the medication.  Patient said that she was told at her last visit that there were other options if the Bactrim did not work for her.  She would like another option and to discuss possibly starting Humira.  Patient uses [facility] in [facility] [phone].  (Chart #0757)

## 2019-11-28 NOTE — Telephone Encounter (Signed)
Please double check the last time she was actually seen in the office.  She would absolutely need a follow-up visit before any other systemic therapy is considered.

## 2019-11-30 NOTE — Telephone Encounter (Signed)
Patient aware ov is needed she stated after holidays is fine 01/28/2020

## 2020-01-28 ENCOUNTER — Ambulatory Visit: Payer: Self-pay | Admitting: Dermatology

## 2020-02-12 ENCOUNTER — Ambulatory Visit: Payer: Self-pay | Admitting: Allergy

## 2020-02-29 ENCOUNTER — Ambulatory Visit: Payer: Self-pay | Admitting: Dermatology

## 2020-03-02 ENCOUNTER — Ambulatory Visit: Payer: Self-pay | Admitting: Dermatology

## 2020-03-30 ENCOUNTER — Ambulatory Visit: Payer: Self-pay | Admitting: Dermatology

## 2020-05-06 ENCOUNTER — Ambulatory Visit: Payer: Self-pay | Admitting: Allergy

## 2020-05-11 ENCOUNTER — Ambulatory Visit: Payer: Self-pay | Admitting: Dermatology

## 2021-01-31 ENCOUNTER — Ambulatory Visit: Payer: Self-pay | Admitting: Dermatology

## 2021-04-07 DIAGNOSIS — N939 Abnormal uterine and vaginal bleeding, unspecified: Secondary | ICD-10-CM | POA: Diagnosis not present

## 2021-04-07 DIAGNOSIS — D649 Anemia, unspecified: Secondary | ICD-10-CM | POA: Diagnosis not present

## 2021-04-07 DIAGNOSIS — S3141XA Laceration without foreign body of vagina and vulva, initial encounter: Secondary | ICD-10-CM | POA: Diagnosis not present

## 2021-04-07 DIAGNOSIS — R579 Shock, unspecified: Secondary | ICD-10-CM | POA: Diagnosis not present

## 2021-04-19 DIAGNOSIS — S3141XD Laceration without foreign body of vagina and vulva, subsequent encounter: Secondary | ICD-10-CM | POA: Diagnosis not present

## 2021-06-03 ENCOUNTER — Ambulatory Visit (HOSPITAL_COMMUNITY): Payer: Self-pay

## 2021-06-05 ENCOUNTER — Ambulatory Visit (HOSPITAL_COMMUNITY)
Admission: RE | Admit: 2021-06-05 | Discharge: 2021-06-05 | Disposition: A | Discharge status: Home / Self Care | Payer: Medicaid Other | Source: Ambulatory Visit | Attending: Family Medicine | Admitting: Family Medicine

## 2021-06-05 ENCOUNTER — Encounter (HOSPITAL_COMMUNITY): Payer: Self-pay

## 2021-06-05 VITALS — BP 148/93 | HR 88 | Temp 98.3°F | Resp 17

## 2021-06-05 DIAGNOSIS — H60393 Other infective otitis externa, bilateral: Secondary | ICD-10-CM

## 2021-06-05 MED ORDER — CIPROFLOXACIN-DEXAMETHASONE 0.3-0.1 % OT SUSP: 4.0000 [drp] | Freq: Two times a day (BID) | OTIC | 1 refills | Status: DC

## 2021-06-05 NOTE — ED Triage Notes (Signed)
Pt is present today with bilateral ear drainage. Pt sx started ten days ago.

## 2021-06-07 NOTE — ED Provider Notes (Signed)
  [facility]   [id number] 06/05/21 Arrival Time: 1301  ASSESSMENT & PLAN:  1. Infective otitis externa of both ears    Acute on chronic. Does see ENT. Ear wicks placed in office and let stand for 5-10 minutes; removed as she does not wish to leave in. Has absorbed a good bit of drainage from ears. Will start below gtts immediately. Usu her ENT treats for at least 2 weeks.  Meds ordered this encounter  Medications   ciprofloxacin-dexamethasone (CIPRODEX) OTIC suspension    Sig: Place 4 drops into both ears 2 (two) times daily.    Dispense:  7.5 mL    Refill:  1   May f/u here as needed. Reviewed expectations re: course of current medical issues. Questions answered. Outlined signs and symptoms indicating need for more acute intervention. Patient verbalized understanding. After Visit Summary given.   SUBJECTIVE: History from: patient.  Kaylee Mcclure is a 31 y.o. female who presents with complaint of recurrent and chronic otitis externa; has been followed by ENT. Past few days with return of infection; purulent drainage from both ears. Is painful. Afebrile. No tx PTA. No bleeding reported.  Social History   Tobacco Use  Smoking Status Never  Smokeless Tobacco Not on file     OBJECTIVE:  Vitals:   06/05/21 1331  BP: (!) 148/93  Pulse: 88  Resp: 17  Temp: 98.3 F (36.8 C)  SpO2: 97%     General appearance: alert; appears fatigued Ear Canal: edema and inflammation bilaterally with purulent discharge TM: cannot visualize Neck: supple without LAD Lungs: unlabored respirations Skin: warm and dry Psychological: alert and cooperative; normal mood and affect  No Known Allergies  History reviewed. No pertinent past medical history. Family History  Problem Relation Age of Onset   Healthy Mother    Healthy Father    Social History   Socioeconomic History   Marital status: Single    Spouse name: Not on file   Number of children: Not on file   Years  of education: Not on file   Highest education level: Not on file  Occupational History   Not on file  Tobacco Use   Smoking status: Never   Smokeless tobacco: Not on file  Substance and Sexual Activity   Alcohol use: Yes    Comment: social   Drug use: Not on file   Sexual activity: Not on file  Other Topics Concern   Not on file  Social History Narrative   Not on file   Social Determinants of Health   Financial Resource Strain: Not on file  Food Insecurity: Not on file  Transportation Needs: Not on file  Physical Activity: Not on file  Stress: Not on file  Social Connections: Not on file  Intimate Partner Violence: Not on file             Mardella Layman, MD 06/07/21 1106

## 2021-08-17 ENCOUNTER — Other Ambulatory Visit: Payer: Self-pay

## 2021-08-17 ENCOUNTER — Encounter (HOSPITAL_COMMUNITY): Payer: Self-pay | Admitting: *Deleted

## 2021-08-17 ENCOUNTER — Ambulatory Visit (HOSPITAL_COMMUNITY)
Admission: EM | Admit: 2021-08-17 | Discharge: 2021-08-17 | Disposition: A | Discharge status: Home / Self Care | Payer: Medicaid Other | Attending: Family Medicine | Admitting: Family Medicine

## 2021-08-17 DIAGNOSIS — L708 Other acne: Secondary | ICD-10-CM | POA: Diagnosis not present

## 2021-08-17 DIAGNOSIS — J0100 Acute maxillary sinusitis, unspecified: Secondary | ICD-10-CM

## 2021-08-17 MED ORDER — CLINDAMYCIN PHOSPHATE 1 % EX SOLN: Freq: Two times a day (BID) | CUTANEOUS | 1 refills | Status: DC

## 2021-08-17 MED ORDER — AMOXICILLIN-POT CLAVULANATE 875-125 MG PO TABS: 1.0000 | ORAL_TABLET | Freq: Two times a day (BID) | ORAL | 0 refills | Status: DC

## 2021-08-17 NOTE — ED Provider Notes (Signed)
  [facility]   [id number] 08/17/21 Arrival Time: 0940  ASSESSMENT & PLAN:  1. Acute maxillary sinusitis, recurrence not specified   2. Acne-like skin bumps    Begin: Meds ordered this encounter  Medications   amoxicillin-clavulanate (AUGMENTIN) 875-125 MG tablet    Sig: Take 1 tablet by mouth every 12 (twelve) hours.    Dispense:  20 tablet    Refill:  0   clindamycin (CLEOCIN T) 1 % external solution    Sig: Apply topically 2 (two) times daily.    Dispense:  30 mL    Refill:  1    Discussed typical duration of symptoms. OTC symptom care as needed. Ensure adequate fluid intake and rest.   Follow-up Information     Schedule an appointment as soon as possible for a visit  with Aliene Beams, MD.   Specialty: Family Medicine Contact information: [address] [place] [address] [phone]                 Reviewed expectations re: course of current medical issues. Questions answered. Outlined signs and symptoms indicating need for more acute intervention. Patient verbalized understanding. After Visit Summary given.   SUBJECTIVE: History from: patient.  Kaylee Mcclure is a 31 y.o. female who presents with complaint of nasal congestion, post-nasal drainage, and sinus pain. Onset gradual,  past week; worse past 2 days . Respiratory symptoms: none. Fever: absent. Overall normal PO intake without n/v. No tx PTA. Seasonal allergies: yes. No specific aggravating or alleviating factors reported.  Social History   Tobacco Use  Smoking Status Never  Smokeless Tobacco Not on file   Also with acne-like eruption of face; unclear trigger. Few weeks. Mild itching.  OBJECTIVE:  Vitals:   08/17/21 1020  BP: 115/68  Pulse: 79  Resp: 18  Temp: 97.7 F (36.5 C)  SpO2: 99%     General appearance: alert; no distress HEENT: nasal congestion; clear runny nose; throat irritation secondary to post-nasal drainage; bilateral maxillary tenderness to  palpation; turbinates boggy Neck: supple without LAD; trachea midline Lungs: unlabored respirations, symmetrical air entry; cough: absent; no respiratory distress Skin: warm and dry; mild acne-like eruption of chin and cheeks Psychological: alert and cooperative; normal mood and affect  No Known Allergies  History reviewed. No pertinent past medical history. Family History  Problem Relation Age of Onset   Healthy Mother    Healthy Father    Social History   Socioeconomic History   Marital status: Single    Spouse name: Not on file   Number of children: Not on file   Years of education: Not on file   Highest education level: Not on file  Occupational History   Not on file  Tobacco Use   Smoking status: Never   Smokeless tobacco: Not on file  Substance and Sexual Activity   Alcohol use: Yes    Comment: social   Drug use: Not on file   Sexual activity: Not on file  Other Topics Concern   Not on file  Social History Narrative   Not on file   Social Determinants of Health   Financial Resource Strain: Not on file  Food Insecurity: Not on file  Transportation Needs: Not on file  Physical Activity: Not on file  Stress: Not on file  Social Connections: Not on file  Intimate Partner Violence: Not on file             Mardella Layman, MD 08/17/21 1059

## 2021-08-17 NOTE — ED Triage Notes (Signed)
Pt reports she has a sinus infection . Pt also has dermatitis around mouth and face . Can not see Derm. Until next month.

## 2021-09-27 DIAGNOSIS — Z5181 Encounter for therapeutic drug level monitoring: Secondary | ICD-10-CM | POA: Diagnosis not present

## 2021-09-27 DIAGNOSIS — L700 Acne vulgaris: Secondary | ICD-10-CM | POA: Diagnosis not present

## 2021-10-16 DIAGNOSIS — N939 Abnormal uterine and vaginal bleeding, unspecified: Secondary | ICD-10-CM | POA: Diagnosis not present

## 2021-10-16 DIAGNOSIS — N760 Acute vaginitis: Secondary | ICD-10-CM | POA: Diagnosis not present

## 2021-10-16 DIAGNOSIS — Z309 Encounter for contraceptive management, unspecified: Secondary | ICD-10-CM | POA: Diagnosis not present

## 2021-10-16 DIAGNOSIS — L293 Anogenital pruritus, unspecified: Secondary | ICD-10-CM | POA: Diagnosis not present

## 2021-10-16 DIAGNOSIS — Z113 Encounter for screening for infections with a predominantly sexual mode of transmission: Secondary | ICD-10-CM | POA: Diagnosis not present

## 2021-10-23 ENCOUNTER — Encounter (HOSPITAL_COMMUNITY): Payer: Self-pay

## 2021-10-23 ENCOUNTER — Ambulatory Visit (HOSPITAL_COMMUNITY)
Admission: RE | Admit: 2021-10-23 | Discharge: 2021-10-23 | Disposition: A | Discharge status: Home / Self Care | Payer: Medicaid Other | Source: Ambulatory Visit | Attending: Emergency Medicine | Admitting: Emergency Medicine

## 2021-10-23 VITALS — BP 123/79 | HR 77 | Temp 98.6°F | Resp 16

## 2021-10-23 DIAGNOSIS — J0140 Acute pansinusitis, unspecified: Secondary | ICD-10-CM

## 2021-10-23 DIAGNOSIS — H60392 Other infective otitis externa, left ear: Secondary | ICD-10-CM

## 2021-10-23 MED ORDER — AMOXICILLIN-POT CLAVULANATE 875-125 MG PO TABS: 1.0000 | ORAL_TABLET | Freq: Two times a day (BID) | ORAL | 0 refills | Status: DC

## 2021-10-23 MED ORDER — CIPROFLOXACIN-DEXAMETHASONE 0.3-0.1 % OT SUSP: 4.0000 [drp] | Freq: Two times a day (BID) | OTIC | 0 refills | Status: DC

## 2021-10-23 NOTE — Discharge Instructions (Signed)
On exam there is green drainage to your left ear which is indicative of a outer ear infection and that there is congestion within your nose and tenderness is within your sinuses which is consistent with a sinus infection, moving forward if you have sinus infection symptoms for at least 10 days we can give you an antibiotic  Begin Augmentin every morning and every evening for the next 10 days, ideally you will see improvement after 48 hours of medication use, please notify your dermatologist of antibiotic and it was not listed as a contradiction as you are about to begin Accutane  Place 4 drops of Ciprodex every morning and every evening into the left ear  Avoid any ear cleaning or object placement into the ear canal until all symptoms have resolved and treatment is complete to prevent further irritation  Go ahead and make an appointment with your ear nose and throat doctor for further evaluation and management of your symptoms, may follow-up with urgent care until able to be seen   For cough: honey 1/2 to 1 teaspoon (you can dilute the honey in water or another fluid).  You can also use guaifenesin and dextromethorphan for cough. You can use a humidifier for chest congestion and cough.  If you don't have a humidifier, you can sit in the bathroom with the hot shower running.      For sore throat: try warm salt water gargles, cepacol lozenges, throat spray, warm tea or water with lemon/honey, popsicles or ice, or OTC cold relief medicine for throat discomfort.   For congestion: take a daily anti-histamine like Zyrtec, Claritin, and a oral decongestant, such as pseudoephedrine.  You can also use Flonase 1-2 sprays in each nostril daily.   It is important to stay hydrated: drink plenty of fluids (water, gatorade/powerade/pedialyte, juices, or teas) to keep your throat moisturized and help further relieve irritation/discomfort.

## 2021-10-23 NOTE — ED Provider Notes (Signed)
[place]    CSN: [id number] Arrival date & time: 10/23/21  1035      History   Chief Complaint Chief Complaint  Patient presents with   Ear Drainage    I've been having air drainage for about two weeks - Entered by patient   appt 1100   Facial Pain    HPI Kaylee Mcclure is a 31 y.o. female.   Patient presents with nasal congestion, rhinorrhea, postnasal drip, sinus pain and pressure for 3 weeks.  Associated left-sided ear drainage and a sensation of feeling clogged for 3 weeks.  Drainage is a yellow to brown color and causes the ear to itch.  Sinus pressure is primarily around the eyes and the cheeks.  Patient has been experiencing body aches today.  Has attempted use of ibuprofen and old prescription of Ciprodex but she ran out of medication.  Patient endorses that she has had issues with her sinuses ever since moving back to [place] from the [place].  Endorses that as a child she had recurrent ear infections and had to be followed by ear nose and throat.  Has attempted to reach out to physician but unable to be seen for months.  Sinus for 3 weeks. Ibuprofen, no sick cotnacts, eating,   Left ear drainiage. Feels clogged, itching, yellow and brown, ran old prescription of ciprodex,   History reviewed. No pertinent past medical history.  There are no problems to display for this patient.   Past Surgical History:  Procedure Laterality Date   MYRINGOTOMY     TONSILLECTOMY      OB History   No obstetric history on file.      Home Medications    Prior to Admission medications   Medication Sig Start Date End Date Taking? Authorizing Provider  amoxicillin-clavulanate (AUGMENTIN) 875-125 MG tablet Take 1 tablet by mouth every 12 (twelve) hours. 08/17/21   Vanessa Kick, MD  clindamycin (CLEOCIN T) 1 % external solution Apply topically 2 (two) times daily. 08/17/21   Vanessa Kick, MD  fexofenadine-pseudoephedrine (ALLEGRA-D) 60-120 MG 12 hr  tablet Take 1 tablet by mouth every 12 (twelve) hours. 12/07/16   Tasia Catchings, Amy V, PA-C  fluticasone (FLONASE) 50 MCG/ACT nasal spray Place 2 sprays into the nose daily. 08/08/12   Harden Mo, MD  hydroquinone 4 % cream Apply topically 2 (two) times daily. 04/14/19   Clark-Burning, Anderson Malta, PA-C  ibuprofen (ADVIL,MOTRIN) 600 MG tablet Take 1 tablet (600 mg total) by mouth 3 (three) times daily after meals. 05/25/12   de [place] M, MD  ipratropium (ATROVENT) 0.06 % nasal spray Place 2 sprays into both nostrils 4 (four) times daily. 12/07/16   Ok Edwards, PA-C    Family History Family History  Problem Relation Age of Onset   Healthy Mother    Healthy Father     Social History Social History   Tobacco Use   Smoking status: Never  Substance Use Topics   Alcohol use: Yes    Comment: social     Allergies   Latex   Review of Systems Review of Systems  Constitutional: Negative.   HENT:  Positive for congestion, ear discharge, postnasal drip, rhinorrhea, sinus pressure and sinus pain. Negative for dental problem, drooling, ear pain, facial swelling, hearing loss, mouth sores, nosebleeds, sneezing, sore throat, tinnitus, trouble swallowing and voice change.   Respiratory:  Negative for apnea, cough, choking, chest tightness, shortness of breath, wheezing and stridor.  Cardiovascular: Negative.   Gastrointestinal: Negative.   Musculoskeletal:  Positive for myalgias. Negative for arthralgias, back pain, gait problem, joint swelling, neck pain and neck stiffness.  Skin: Negative.      Physical Exam Triage Vital Signs ED Triage Vitals  Enc Vitals Group     BP 10/23/21 1113 123/79     Pulse Rate 10/23/21 1113 77     Resp 10/23/21 1113 16     Temp 10/23/21 1113 98.6 F (37 C)     Temp src --      SpO2 10/23/21 1113 100 %     Weight --      Height --      Head Circumference --      Peak Flow --      Pain Score 10/23/21 1111 6     Pain Loc --      Pain Edu? --       Excl. in GC? --    No data found.  Updated Vital Signs BP 123/79 (BP Location: Left Arm)   Pulse 77   Temp 98.6 F (37 C)   Resp 16   LMP 09/29/2021   SpO2 100%   Visual Acuity Right Eye Distance:   Left Eye Distance:   Bilateral Distance:    Right Eye Near:   Left Eye Near:    Bilateral Near:     Physical Exam Constitutional:      Appearance: Normal appearance.  HENT:     Head: Normocephalic.     Right Ear: Tympanic membrane, ear canal and external ear normal.     Ears:     Comments: No abnormalities to the tympanic membranes, green drainage present to the left ear canal with mild swelling, no erythema noted, no abnormalities to the external ear    Nose: Congestion and rhinorrhea present.     Right Sinus: Maxillary sinus tenderness and frontal sinus tenderness present.     Left Sinus: Maxillary sinus tenderness and frontal sinus tenderness present.     Mouth/Throat:     Mouth: Mucous membranes are moist.     Pharynx: Oropharynx is clear.  Eyes:     Extraocular Movements: Extraocular movements intact.  Cardiovascular:     Rate and Rhythm: Normal rate and regular rhythm.     Pulses: Normal pulses.     Heart sounds: Normal heart sounds.  Pulmonary:     Effort: Pulmonary effort is normal.     Breath sounds: Normal breath sounds.  Neurological:     Mental Status: She is alert and oriented to person, place, and time.      UC Treatments / Results  Labs (all labs ordered are listed, but only abnormal results are displayed) Labs Reviewed - No data to display  EKG   Radiology No results found.  Procedures Procedures (including critical care time)  Medications Ordered in UC Medications - No data to display  Initial Impression / Assessment and Plan / UC Course  I have reviewed the triage vital signs and the nursing notes.  Pertinent labs & imaging results that were available during my care of the patient were reviewed by me and considered in my medical  decision making (see chart for details).  Infective otitis externa of left ear, acute nonrecurrent pansinusitis  Vital signs are stable and well in no signs of distress patient is ill-appearing, symptomology is consistent with outer ear infection as well as a sinus infection, discussed as symptoms have been present for 3 weeks,  will provide coverage for bacteria, Augmentin prescribed as well as Ciprodex, discussed administration, recommended avoidance of any ear cleaning until symptoms have resolved and treatment is complete, advised over-the-counter medications and additional supportive care for sinus symptoms, advised patient to make an appointment with her ear nose and throat specialist for further evaluation and management with follow-up with urgent care as needed, patient endorses that she is to start Accutane treatment this Friday, prescribed medications are not listed as contradictions but advised to notify dermatologist of use  Final Clinical Impressions(s) / UC Diagnoses   Final diagnoses:  None   Discharge Instructions   None    ED Prescriptions   None    PDMP not reviewed this encounter.   Valinda Hoar, NP 10/23/21 1152

## 2021-10-23 NOTE — ED Triage Notes (Signed)
Pt c/o left ear pain and drainage, sinus pain and congestion for all most 3 weeks. Tried home remedies without relief.

## 2021-11-27 DIAGNOSIS — Z5181 Encounter for therapeutic drug level monitoring: Secondary | ICD-10-CM | POA: Diagnosis not present

## 2021-11-27 DIAGNOSIS — L700 Acne vulgaris: Secondary | ICD-10-CM | POA: Diagnosis not present

## 2021-11-27 DIAGNOSIS — Z79899 Other long term (current) drug therapy: Secondary | ICD-10-CM | POA: Diagnosis not present

## 2021-11-27 DIAGNOSIS — L309 Dermatitis, unspecified: Secondary | ICD-10-CM | POA: Diagnosis not present

## 2021-12-27 DIAGNOSIS — L309 Dermatitis, unspecified: Secondary | ICD-10-CM | POA: Diagnosis not present

## 2021-12-27 DIAGNOSIS — L700 Acne vulgaris: Secondary | ICD-10-CM | POA: Diagnosis not present

## 2021-12-27 DIAGNOSIS — Z79899 Other long term (current) drug therapy: Secondary | ICD-10-CM | POA: Diagnosis not present

## 2021-12-27 DIAGNOSIS — Z5181 Encounter for therapeutic drug level monitoring: Secondary | ICD-10-CM | POA: Diagnosis not present

## 2022-01-30 DIAGNOSIS — L853 Xerosis cutis: Secondary | ICD-10-CM | POA: Diagnosis not present

## 2022-01-30 DIAGNOSIS — Z79899 Other long term (current) drug therapy: Secondary | ICD-10-CM | POA: Diagnosis not present

## 2022-01-30 DIAGNOSIS — L700 Acne vulgaris: Secondary | ICD-10-CM | POA: Diagnosis not present

## 2022-01-30 DIAGNOSIS — Z5181 Encounter for therapeutic drug level monitoring: Secondary | ICD-10-CM | POA: Diagnosis not present

## 2022-02-24 DIAGNOSIS — U071 COVID-19: Secondary | ICD-10-CM | POA: Diagnosis not present

## 2022-02-24 DIAGNOSIS — R6883 Chills (without fever): Secondary | ICD-10-CM | POA: Diagnosis not present

## 2022-03-01 DIAGNOSIS — Z5181 Encounter for therapeutic drug level monitoring: Secondary | ICD-10-CM | POA: Diagnosis not present

## 2022-03-01 DIAGNOSIS — L700 Acne vulgaris: Secondary | ICD-10-CM | POA: Diagnosis not present

## 2022-03-15 DIAGNOSIS — Z8669 Personal history of other diseases of the nervous system and sense organs: Secondary | ICD-10-CM | POA: Diagnosis not present

## 2022-03-15 DIAGNOSIS — H9211 Otorrhea, right ear: Secondary | ICD-10-CM | POA: Diagnosis not present

## 2022-03-29 DIAGNOSIS — Z5181 Encounter for therapeutic drug level monitoring: Secondary | ICD-10-CM | POA: Diagnosis not present

## 2022-03-29 DIAGNOSIS — L700 Acne vulgaris: Secondary | ICD-10-CM | POA: Diagnosis not present

## 2022-05-03 DIAGNOSIS — L730 Acne keloid: Secondary | ICD-10-CM | POA: Diagnosis not present

## 2022-05-03 DIAGNOSIS — L700 Acne vulgaris: Secondary | ICD-10-CM | POA: Diagnosis not present

## 2022-05-03 DIAGNOSIS — Z5181 Encounter for therapeutic drug level monitoring: Secondary | ICD-10-CM | POA: Diagnosis not present

## 2022-05-10 DIAGNOSIS — J029 Acute pharyngitis, unspecified: Secondary | ICD-10-CM | POA: Diagnosis not present

## 2022-05-10 DIAGNOSIS — J069 Acute upper respiratory infection, unspecified: Secondary | ICD-10-CM | POA: Diagnosis not present

## 2022-05-10 DIAGNOSIS — H9203 Otalgia, bilateral: Secondary | ICD-10-CM | POA: Diagnosis not present

## 2022-05-10 DIAGNOSIS — J302 Other seasonal allergic rhinitis: Secondary | ICD-10-CM | POA: Diagnosis not present

## 2022-05-30 DIAGNOSIS — H7291 Unspecified perforation of tympanic membrane, right ear: Secondary | ICD-10-CM | POA: Diagnosis not present

## 2022-05-30 DIAGNOSIS — H9193 Unspecified hearing loss, bilateral: Secondary | ICD-10-CM | POA: Diagnosis not present

## 2022-05-30 DIAGNOSIS — H9213 Otorrhea, bilateral: Secondary | ICD-10-CM | POA: Diagnosis not present

## 2022-06-07 DIAGNOSIS — Z5181 Encounter for therapeutic drug level monitoring: Secondary | ICD-10-CM | POA: Diagnosis not present

## 2022-06-07 DIAGNOSIS — L700 Acne vulgaris: Secondary | ICD-10-CM | POA: Diagnosis not present

## 2022-07-16 DIAGNOSIS — B9689 Other specified bacterial agents as the cause of diseases classified elsewhere: Secondary | ICD-10-CM | POA: Diagnosis not present

## 2022-07-16 DIAGNOSIS — N760 Acute vaginitis: Secondary | ICD-10-CM | POA: Diagnosis not present

## 2022-07-16 DIAGNOSIS — B3731 Acute candidiasis of vulva and vagina: Secondary | ICD-10-CM | POA: Diagnosis not present

## 2022-07-16 DIAGNOSIS — Z01419 Encounter for gynecological examination (general) (routine) without abnormal findings: Secondary | ICD-10-CM | POA: Diagnosis not present

## 2022-07-16 DIAGNOSIS — Z114 Encounter for screening for human immunodeficiency virus [HIV]: Secondary | ICD-10-CM | POA: Diagnosis not present

## 2022-07-16 DIAGNOSIS — Z113 Encounter for screening for infections with a predominantly sexual mode of transmission: Secondary | ICD-10-CM | POA: Diagnosis not present

## 2022-08-02 DIAGNOSIS — N898 Other specified noninflammatory disorders of vagina: Secondary | ICD-10-CM | POA: Diagnosis not present

## 2022-08-06 DIAGNOSIS — L650 Telogen effluvium: Secondary | ICD-10-CM | POA: Diagnosis not present

## 2022-08-06 DIAGNOSIS — L219 Seborrheic dermatitis, unspecified: Secondary | ICD-10-CM | POA: Diagnosis not present

## 2022-09-20 DIAGNOSIS — R0982 Postnasal drip: Secondary | ICD-10-CM | POA: Diagnosis not present

## 2022-09-20 DIAGNOSIS — H9211 Otorrhea, right ear: Secondary | ICD-10-CM | POA: Diagnosis not present

## 2022-09-20 DIAGNOSIS — H6122 Impacted cerumen, left ear: Secondary | ICD-10-CM | POA: Diagnosis not present

## 2022-10-11 DIAGNOSIS — N898 Other specified noninflammatory disorders of vagina: Secondary | ICD-10-CM | POA: Diagnosis not present

## 2022-10-11 DIAGNOSIS — B3731 Acute candidiasis of vulva and vagina: Secondary | ICD-10-CM | POA: Diagnosis not present

## 2023-01-07 DIAGNOSIS — L732 Hidradenitis suppurativa: Secondary | ICD-10-CM | POA: Diagnosis not present

## 2023-01-07 DIAGNOSIS — N9089 Other specified noninflammatory disorders of vulva and perineum: Secondary | ICD-10-CM | POA: Diagnosis not present

## 2023-01-07 DIAGNOSIS — N898 Other specified noninflammatory disorders of vagina: Secondary | ICD-10-CM | POA: Diagnosis not present

## 2023-01-07 DIAGNOSIS — Z299 Encounter for prophylactic measures, unspecified: Secondary | ICD-10-CM | POA: Diagnosis not present

## 2023-01-07 DIAGNOSIS — N941 Unspecified dyspareunia: Secondary | ICD-10-CM | POA: Diagnosis not present

## 2023-02-02 DIAGNOSIS — J101 Influenza due to other identified influenza virus with other respiratory manifestations: Secondary | ICD-10-CM | POA: Diagnosis not present

## 2023-02-02 DIAGNOSIS — R509 Fever, unspecified: Secondary | ICD-10-CM | POA: Diagnosis not present

## 2023-02-02 DIAGNOSIS — R000 Tachycardia, unspecified: Secondary | ICD-10-CM | POA: Diagnosis not present

## 2023-02-02 DIAGNOSIS — J029 Acute pharyngitis, unspecified: Secondary | ICD-10-CM | POA: Diagnosis not present

## 2023-02-03 ENCOUNTER — Ambulatory Visit (HOSPITAL_COMMUNITY): Payer: Medicaid Other

## 2023-02-11 DIAGNOSIS — L219 Seborrheic dermatitis, unspecified: Secondary | ICD-10-CM | POA: Diagnosis not present

## 2023-02-11 DIAGNOSIS — L658 Other specified nonscarring hair loss: Secondary | ICD-10-CM | POA: Diagnosis not present

## 2023-02-11 DIAGNOSIS — L650 Telogen effluvium: Secondary | ICD-10-CM | POA: Diagnosis not present

## 2023-05-31 DIAGNOSIS — F902 Attention-deficit hyperactivity disorder, combined type: Secondary | ICD-10-CM | POA: Diagnosis not present

## 2023-06-06 DIAGNOSIS — F902 Attention-deficit hyperactivity disorder, combined type: Secondary | ICD-10-CM | POA: Diagnosis not present

## 2023-07-03 DIAGNOSIS — Z23 Encounter for immunization: Secondary | ICD-10-CM | POA: Diagnosis not present

## 2023-07-05 DIAGNOSIS — F902 Attention-deficit hyperactivity disorder, combined type: Secondary | ICD-10-CM | POA: Diagnosis not present

## 2023-08-01 DIAGNOSIS — F902 Attention-deficit hyperactivity disorder, combined type: Secondary | ICD-10-CM | POA: Diagnosis not present

## 2023-08-29 DIAGNOSIS — F902 Attention-deficit hyperactivity disorder, combined type: Secondary | ICD-10-CM | POA: Diagnosis not present

## 2023-09-23 DIAGNOSIS — F902 Attention-deficit hyperactivity disorder, combined type: Secondary | ICD-10-CM | POA: Diagnosis not present

## 2023-10-16 DIAGNOSIS — F902 Attention-deficit hyperactivity disorder, combined type: Secondary | ICD-10-CM | POA: Diagnosis not present

## 2024-01-15 NOTE — Progress Notes (Signed)
 Otolaryngology Clinic Note  HPI:    Follow-up (Pt presents for post nasal drip)  History of Present Illness Ms. Hegg is a 33 year old female who presents today with concerns of postnasal drip. She has been seen previously for ear complaints and postnasal drip. During prior visits, a perforation in her right eardrum was noted.  She reports experiencing significant postnasal drip and sniffling since August 2024. Her symptoms include nasal congestion, rhinorrhea, and intermittent sneezing fits. She also experiences occasional ear clogging, which she manages by popping her eustachian tube. She has been self-medicating with Allegra 24-hour twice daily, but this regimen has not provided any relief. She recalls using a nasal spray in the past, which she believes was more effective in managing her allergy symptoms.  In 2021, she underwent a fiberoptic nasal endoscopy following a spider bite incident in [place], which revealed no abnormalities except for a small bump. She was prescribed a week-long course of medication at that time. She has not yet undergone allergy testing or titer checks.  SOCIAL HISTORY She is in trauma counseling.  FAMILY HISTORY Her mother has a history of postnasal drip and related symptoms.  PMH/Meds/All/SocHx/FamHx/ROS:   Medical History[1]  Surgical History[2]  No family history of bleeding disorders, wound healing problems or difficulty with anesthesia.   Social History   Socioeconomic History   Marital status: Single    Spouse name: Not on file   Number of children: Not on file   Years of education: Not on file   Highest education level: Not on file  Occupational History   Not on file  Tobacco Use   Smoking status: Never   Smokeless tobacco: Never  Substance and Sexual Activity   Alcohol use: Yes   Drug use: No   Sexual activity: Not on file  Other Topics Concern   Not on file  Social History Narrative   Not on file   Social Drivers  of Health   Living Situation: Not on file  Food Insecurity: Not on file  Transportation Needs: Not on file  Utilities: Not on file  Safety: Unknown (10/11/2021)   Received from [facility]   HITS    Scream or Curse: Not on file    Physically Hurt: Not on file    Insult or Talk Down To: Not on file    Threaten Physical Harm: Not on file  Alcohol Screening: Not on file  Tobacco Use: Low Risk (01/15/2024)   Patient History    Smoking Tobacco Use: Never    Smokeless Tobacco Use: Never    Passive Exposure: Not on file  Depression: Not at risk (03/15/2022)   Received from [facility] visits prior to 03/17/2022.   PHQ-2    SDOH PHQ2 SCORE: 0  Social Connections: Unknown (10/11/2021)   Received from [facility]   Social Network    Social Network: Not on file  Financial Resource Strain: Not on file    Current Medications[3]  A complete ROS was performed with pertinent positives/negatives noted in the HPI. The remainder of the ROS are negative.    Physical Exam:    Temp 97.8 F (36.6 C) (Temporal)   Ht 1.702 m (5' 7)   Wt 58.8 kg (129 lb 9.6 oz)   BMI 20.30 kg/m    General: Well developed, well nourished. No acute distress. Voice normal.  Head/Face: Normocephalic, atraumatic. No scars or lesions. No sinus tenderness. Facial nerve intact and equal bilaterally. No facial lacerations. TMJ nontender to  palpation.  Eyes: Pupils are equal, round and reactive to light. Conjunctiva and lids are normal. Normal extraocular mobility.   Ears:   Right: Pinna and external meatus normal, normal ear canal skin and caliber without excessive cerumen or drainage. Tympanic membrane 15 % anterior-inferior perforation. Tympanic membrane without effusion or infection.    Left: Pinna and external meatus normal, normal ear canal skin and caliber without excessive cerumen or drainage. Tympanic membrane intact without effusion or infection.   Hearing: Normal speech  reception to conversational tone.  Nose: No gross deformity or lesions. No purulent discharge. Septum midline, mild turbinate hypertrophy with allergic mucosa.  Mouth/Oropharynx: Lips normal, teeth and gums normal with good dentition, normal oral vestibule. Normal floor of mouth, tongue and oral mucosa, no mucosal lesions, ulcer or mass, normal tongue mobility. Uvula midline. Hard and soft palate normal with normal mobility. +1 tonsils, no erythema or exudate.  Larynx: See TFL if applicable  Nasopharynx: See TFL if applicable  Neck: Trachea midline. No masses. No crepitus. Normal thyroid gland palpation without thyromegaly or nodules palpated. Salivary glands normal to palpation without swelling, erythema or mass.   Lymphatic: No lymphadenopathy in the neck.  Respiratory: No stridor or distress.  Extremities: No edema or cyanosis. Warm and well-perfused.  Neurologic: CN II-XII intact. Alert and oriented to self, place and time.  Normal reflexes and motor skills, balance and coordination. Moving all extremities without gross abnormality.  Psychiatric:  No unusual anxiety or evidence of depression. Appropriate affect.    Independent Review of Additional Tests or Records:   Medical Records: Review of medical records from this office no outside records reviewed.  1 unique test ordered nasopharyngeal endoscopy 1 unique test reviewed nasopharyngeal endoscopy 01/15/2024.  Procedures:   Flexible Fiberoptic Endoscopy (CPT [id number]):  Indication for procedure: Flexible laryngoscopy was indicated for postnasal drip, rhinorrhea After verbal consent was obtained, 4% lidocaine and oxymetazoline was sprayed into the patient's bilateral nasal cavities.  A flexible fiberoptic laryngoscope was passed through the patient's right naris down to the level of the cords.  Nasal cavity:  The septum is midline.  The nasal cavity is normal.  There are no masses, lesions, or polyps. There is no epistaxis or discharge.   Clear discharge noted. Nasopharynx:  The eustachian tube orifice and fossa of Rossenmuller are normal.  There are no masses or lesions.  Oropharynx & Hypopharynx:  The base of tongue, vallecula, pyriform sinuses, and post-cricoid area are normal.  There are no masses or lesions.  There is no pooling of secretions.  Larynx:  The a-e folds, arytenoids, false cords are normal.  The vocal cords were mobile bilaterally with abduction and good adduction.  There are no masses or lesions.  The patient has good sensation with a strong gag reflex.  Subglottis:  Visualizing the subglottis in an awake patient during an outpatient exam is less reliable than  for a sedated patient in the OR setting. However, the subglottis below the vocal cords and the trachea were visualized and appeared normal with no evidence of stenosis.                   Larynx   Close review was visualized showing the arytenoids and vocal cords however image was not captured on video.  There is no purulent discharge noted of nares nasopharynx oropharynx for hypopharynx. Impression & Plans:  Janeliz Prestwood is a 33 y.o. female with   1. Postnasal drip      2. Rhinorrhea  3. Perforation of right tympanic membrane      4. Allergic rhinitis, unspecified seasonality, unspecified trigger       Assessment & Plan 1. Postnasal drip, rhinorrhea likely allergic rhinitis Persistent postnasal drip and sniffling since 08/2022, with no relief from Allegra 24-hour taken twice daily. A nasal endoscopy was performed today using Afrin and lidocaine spray, revealing no significant abnormalities. A referral to an allergist in [place] will be arranged for further evaluation and management of symptoms.  At patient's request we will also renewed ipratropium since she feels that this provides some benefit for her.  Patient may also may utilize Flonase  daily and saline rinses (NeilMed). Patient to follow-up with ENT if these measure are not effective  in relieving symptoms.  2. Right eardrum perforation. Previously noted perforation of the right eardrum. No current ear complaints, advised to continue dry ear precautions.  Patient to follow-up as needed if she ever develops repeat otorrhea but states that her ears have not been giving her issues.  Medical Decision Making   Patient is here for a return patient visit for evaluation and treatment of:   A. 4 acute uncomplicated illnesses/conditions: level 3: 99213    B. Review and analysis of results:    No unique test ordered no outside notes reviewed this is a low level of complexity of data for analysis and review. Level 2: [phone]     C: Risk of Complications: Over-the-counter drugs or treatments or products were recommended including: Allegra, Flonase , and saline rinses, which poses a low risk of morbidity or mortality, level 3: 99213   Overall level of service: 99213   On the day of the visit I spent 26 minutes preparing to see the patient, referring and communicating with other health care professionals (not separately reported), and documenting clinical information in the electronic medical record.This time does not include any time spent performing procedures or assesments that are separately billable.     Electronically signed by: Olam Vina Simpler, NP 01/15/2024 8:02 AM            [1] Past Medical History: Diagnosis Date   Acne    Anxiety   [2] History reviewed. No pertinent surgical history. [3]  Current Outpatient Medications:    ciprofloxacin -dexamethasone  (CIPRODEX ) 0.3-0.1 % otic suspension, Instill 4 drops into both ears twice daily for 7 days., Disp: 7.5 mL, Rfl: 1   clindamycin  (CLEOCIN  T) 1 % solution, Apply topically 2 (two) times a day., Disp: 60 mL, Rfl: 11   clobetasoL (TEMOVATE) 0.05 % scalp solution, Apply to scalp daily x 1 wk, then every other day as needed for itching and scaling. Not to face., Disp: 50 mL, Rfl: 3    dextroamphetamine-amphetamine (ADDERALL XR) 20 mg 24 hr capsule, Take 1 capsule by mouth every morning, Disp: 30 capsule, Rfl: 0   dextroamphetamine-amphetamine (ADDERALL XR) 25 mg 24 hr capsule, Take 1 capsule by mouth every morning., Disp: 30 capsule, Rfl: 0   dextroamphetamine-amphetamine (Adderall XR) 25 mg 24 hr capsule, Take 1 capsule by mouth every morning., Disp: 6 capsule, Rfl: 0   ipratropium (ATROVENT ) 21 mcg (0.03 %) nasal spray, 2 sprays each nostril TID., Disp: 30 mL, Rfl: 2   spironolactone (ALDACTONE) 50 mg tablet, Take 50mg  tablet daily, Disp: 90 tablet, Rfl: 3   tretinoin (Retin-A) 0.025 % cream, APPY SMALL AMOUNT TO AFFECTED AREAS TWICE WEEKLY AT NIGHT; INCREASE UP TO EVERY DAY ACCORDING TO TOLERANCE, Disp: 45 g, Rfl: 2

## 2024-01-31 ENCOUNTER — Ambulatory Visit (INDEPENDENT_AMBULATORY_CARE_PROVIDER_SITE_OTHER)

## 2024-01-31 ENCOUNTER — Ambulatory Visit (HOSPITAL_COMMUNITY): Admission: EM | Admit: 2024-01-31 | Discharge: 2024-01-31 | Disposition: A | Discharge status: Home / Self Care

## 2024-01-31 ENCOUNTER — Encounter (HOSPITAL_COMMUNITY): Payer: Self-pay

## 2024-01-31 DIAGNOSIS — R0789 Other chest pain: Secondary | ICD-10-CM

## 2024-01-31 DIAGNOSIS — S298XXA Other specified injuries of thorax, initial encounter: Secondary | ICD-10-CM | POA: Diagnosis not present

## 2024-01-31 HISTORY — DX: Attention-deficit hyperactivity disorder, unspecified type: F90.9

## 2024-01-31 LAB — POCT URINE PREGNANCY: Preg Test, Ur: NEGATIVE

## 2024-01-31 MED ORDER — KETOROLAC TROMETHAMINE 60 MG/2ML IM SOLN
INTRAMUSCULAR | Status: AC
  Filled 2024-01-31: qty 2

## 2024-01-31 MED ORDER — KETOROLAC TROMETHAMINE 60 MG/2ML IM SOLN
60.0000 mg | Freq: Once | INTRAMUSCULAR | Status: AC
  Administered 2024-01-31: 60 mg via INTRAMUSCULAR

## 2024-01-31 MED ORDER — KETOROLAC TROMETHAMINE 10 MG PO TABS: 10.0000 mg | ORAL_TABLET | Freq: Four times a day (QID) | ORAL | 0 refills | Status: AC | PRN

## 2024-01-31 MED ORDER — OXYCODONE HCL 5 MG PO TABS: 5.0000 mg | ORAL_TABLET | ORAL | 0 refills | Status: AC | PRN

## 2024-01-31 NOTE — ED Provider Notes (Signed)
 " [facility]    CSN: [id number] Arrival date & time: 01/31/24  9080      History   Chief Complaint No chief complaint on file.   HPI Kaylee Mcclure is a 34 y.o. female.   Discussed the use of AI scribe software for clinical note transcription with the patient, who gave verbal consent to proceed.   Kaylee Mcclure presents with right-sided pain following a fall when getting out of the shower this morning. The patient states that she slipped on the wet flooring and reached to grab the towel rack for support. The towel rack came off the wall, and with the force of grabbing onto it, she fell into the toilet, breaking the top of the toilet. She hit her right side on the commode during the fall. The pain is described as severe and very tender to touch. The pain worsens with deep breathing. The patient denies any shortness of breath. She did not hit her head. She took ibuprofen  800 mg about an hour prior to urgent care arrival. She does not smoke or vape.   The following sections of the patient's history were reviewed and updated as appropriate: allergies, current medications, past family history, past medical history, past social history, past surgical history, and problem list.     Past Medical History:  Diagnosis Date   ADHD     There are no active problems to display for this patient.   Past Surgical History:  Procedure Laterality Date   MYRINGOTOMY     TONSILLECTOMY      OB History   No obstetric history on file.      Home Medications    Prior to Admission medications  Medication Sig Start Date End Date Taking? Authorizing Provider  amphetamine-dextroamphetamine (ADDERALL XR) 25 MG 24 hr capsule Take 25 mg by mouth every morning.   Yes [provider]  ketorolac  (TORADOL ) 10 MG tablet Take 1 tablet (10 mg total) by mouth every 6 (six) hours as needed (pain). Take with food to avoid stomach upset. Do not take any additional NSAIDs while on this. You may take  tylenol  in addition to this if needed for extra pain relief. 01/31/24  Yes Iola Lukes, FNP  loratadine (CLARITIN) 10 MG tablet Take 10 mg by mouth daily.   Yes [provider]  oxyCODONE  (ROXICODONE ) 5 MG immediate release tablet Take 1 tablet (5 mg total) by mouth every 4 (four) hours as needed for severe pain (pain score 7-10). Only take for pain that is not relieved with Toradol  and/or Tylenol  01/31/24  Yes Falyn Rubel, FNP  ipratropium (ATROVENT ) 0.06 % nasal spray Place 2 sprays into both nostrils 4 (four) times daily. 12/07/16   Babara Greig GAILS, PA-C    Family History Family History  Problem Relation Age of Onset   Healthy Mother    Healthy Father     Social History Social History[1]   Allergies   Latex   Review of Systems Review of Systems  Respiratory:  Negative for shortness of breath.   Musculoskeletal:  Positive for arthralgias.  All other systems reviewed and are negative.    Physical Exam Triage Vital Signs ED Triage Vitals [01/31/24 0940]  Encounter Vitals Group     BP 132/85     Girls Systolic BP Percentile      Girls Diastolic BP Percentile      Boys Systolic BP Percentile      Boys Diastolic BP Percentile  Pulse Rate 67     Resp 16     Temp 97.8 F (36.6 C)     Temp Source Oral     SpO2 98 %     Weight      Height      Head Circumference      Peak Flow      Pain Score 8     Pain Loc      Pain Education      Exclude from Growth Chart    No data found.  Updated Vital Signs BP 132/85 (BP Location: Left Arm)   Pulse 67   Temp 97.8 F (36.6 C) (Oral)   Resp 16   LMP 01/24/2024 (Approximate)   SpO2 98%   Visual Acuity Right Eye Distance:   Left Eye Distance:   Bilateral Distance:    Right Eye Near:   Left Eye Near:    Bilateral Near:     Physical Exam Vitals reviewed.  Constitutional:      General: She is awake. She is not in acute distress.    Appearance: Normal appearance. She is well-developed. She is not  ill-appearing, toxic-appearing or diaphoretic.     Comments: Patient grimacing due to pain   HENT:     Head: Normocephalic.     Right Ear: Hearing normal.     Left Ear: Hearing normal.     Nose: Nose normal.     Mouth/Throat:     Mouth: Mucous membranes are moist.  Eyes:     General: Vision grossly intact.     Conjunctiva/sclera: Conjunctivae normal.  Cardiovascular:     Rate and Rhythm: Normal rate and regular rhythm.     Heart sounds: Normal heart sounds.  Pulmonary:     Effort: Pulmonary effort is normal.     Breath sounds: Normal breath sounds and air entry.  Musculoskeletal:        General: Normal range of motion.     Cervical back: Normal range of motion and neck supple.     Thoracic back: Swelling, signs of trauma and tenderness present. No deformity, lacerations, spasms or bony tenderness. Normal range of motion. No scoliosis.       Back:     Comments: There is a small superficial linear abrasion measuring about 1.5 cm in length along the right posterior torso. Surrounding skin is intact. The area is puffy and very tender to touch. No significant erythema or swelling. No crepitus or limitation of ROM. No vertebral tenderness noted.   Skin:    General: Skin is warm and dry.     Findings: Abrasion present.     Comments: See description above and images below   Neurological:     General: No focal deficit present.     Mental Status: She is alert and oriented to person, place, and time.  Psychiatric:        Speech: Speech normal.        Behavior: Behavior is cooperative.         UC Treatments / Results  Labs (all labs ordered are listed, but only abnormal results are displayed) Labs Reviewed  POCT URINE PREGNANCY    EKG   Radiology DG Ribs Unilateral W/Chest Right Result Date: 01/31/2024 CLINICAL DATA:  Rib pain on right side.  Fall. EXAM: RIGHT RIBS AND CHEST - 3+ VIEW COMPARISON:  02/18/2010 FINDINGS: Marker was placed on the medial aspect of the right  chest. Lungs are clear without a pneumothorax. Heart  size is normal. The trachea is midline. No evidence for a displaced right rib fracture. IMPRESSION: No acute abnormality. Electronically Signed   By: Juliene Balder M.D.   On: 01/31/2024 10:27    Procedures Procedures (including critical care time)  Medications Ordered in UC Medications  ketorolac  (TORADOL ) injection 60 mg (60 mg Intramuscular Given 01/31/24 1058)    Initial Impression / Assessment and Plan / UC Course  I have reviewed the triage vital signs and the nursing notes.  Pertinent labs & imaging results that were available during my care of the patient were reviewed by me and considered in my medical decision making (see chart for details).     The patient presents with right-sided posterior torso pain following a mechanical fall while exiting the shower, during which they struck the right posterior torso against the toilet, causing the toilet top to break. Symptoms include localized pain and soreness at the site of impact without shortness of breath, chest pain, or neurologic symptoms. Physical examination reveals focal erythema and tenderness over the right posterior torso without crepitus, deformity, or respiratory compromise. Chest and rib X-rays were obtained and show no evidence of rib fracture, pneumothorax, or other acute cardiopulmonary or osseous abnormalities. Findings are consistent with an isolated soft tissue contusion from blunt force trauma, which is painful but not dangerous.  The patient was treated with an intramuscular Toradol  injection in clinic for anti-inflammatory and analgesic effect. Conservative management was discussed, including use of ice to the affected area today to reduce inflammation, then alternating ice and heat starting the following day as symptoms allow to promote muscle relaxation and comfort. Activity modification was advised, with avoidance of strenuous activity and allowance for adequate rest  while healing occurs. Oral Toradol  was prescribed to be taken every six hours as needed for pain, and a limited supply of oxycodone  was prescribed for severe breakthrough pain not relieved with Toradol  and other conservative measures. The patient was counseled that pain medication may be needed more regularly over the next 24 hours, with gradual tapering as symptoms improve.  The patient was advised to expect gradual improvement over the next several days, with significant improvement typically noted within a few days, though mild soreness may persist for one to two weeks. Follow-up with the primary care provider is recommended if pain is not improving as expected or if additional concerns arise. The patient was instructed to seek emergency evaluation for worsening pain, new shortness of breath, chest pain, abdominal pain, dizziness, weakness, numbness, or any other new or concerning symptoms suggestive of a more serious injury.  Today's evaluation has revealed no signs of a dangerous process. Discussed diagnosis with patient and/or guardian. Patient and/or guardian aware of their diagnosis, possible red flag symptoms to watch out for and need for close follow up. Patient and/or guardian understands verbal and written discharge instructions. Patient and/or guardian comfortable with plan and disposition.  Patient and/or guardian has a clear mental status at this time, good insight into illness (after discussion and teaching) and has clear judgment to make decisions regarding their care  Documentation was completed with the aid of voice recognition software. Transcription may contain typographical errors.  Final Clinical Impressions(s) / UC Diagnoses   Final diagnoses:  Rib pain on right side  Rib contusion, right, initial encounter     Discharge Instructions      You were seen today for pain and bruising to the right side of your back after a fall. Your exam and X-rays  did not show any broken ribs,  lung injury, or other serious problems. This injury is a soft tissue contusion, which can be quite painful but is not dangerous and will heal with time.  You received a Toradol  injection today to help with pain and inflammation. You may use ice on the sore area today to help decrease swelling and pain. Starting tomorrow, you may alternate ice and heat as needed, as ice helps with inflammation and heat helps relax tight muscles. Take the prescribed Toradol  every six hours as needed for pain. While taking this medication, do not use over-the-counter anti-inflammatories such as aspirin, Motrin , ibuprofen , or Aleve, as this may increase the risk of side effects. If needed, you may take Tylenol  (acetaminophen ) 1000 mg every six hours for additional pain relief. This equals two 500 mg tablets at a time. Be careful not to take more than 4000 mg of Tylenol  in a 24-hour period. A small number of oxycodone  tablets were prescribed for severe pain that is not relieved by Toradol  or other comfort measures; use this only if absolutely necessary. Pain may be more intense over the next day or so and may require more frequent medication at first, then should gradually improve. Rest and avoid heavy lifting or strenuous activity while healing. Most people notice significant improvement within a few days, though mild soreness can last one to two weeks.  Follow up with your primary care provider if pain is not improving as expected, if you continue to need strong pain medication after several days, or if you have concerns about returning to normal activity. Seek emergency care right away if you develop trouble breathing, chest pain, increasing or severe back pain, abdominal pain, dizziness, weakness, numbness, fever, or any new or worsening symptoms that concern you.      ED Prescriptions     Medication Sig Dispense Auth. Provider   ketorolac  (TORADOL ) 10 MG tablet Take 1 tablet (10 mg total) by mouth every 6 (six) hours as  needed (pain). Take with food to avoid stomach upset. Do not take any additional NSAIDs while on this. You may take tylenol  in addition to this if needed for extra pain relief. 20 tablet Iola Lukes, FNP   oxyCODONE  (ROXICODONE ) 5 MG immediate release tablet Take 1 tablet (5 mg total) by mouth every 4 (four) hours as needed for severe pain (pain score 7-10). Only take for pain that is not relieved with Toradol  and/or Tylenol  6 tablet Iola Lukes, FNP      I have reviewed the PDMP during this encounter.     [1]  Social History Tobacco Use   Smoking status: Never  Vaping Use   Vaping status: Never Used  Substance Use Topics   Alcohol use: Yes    Comment: social   Drug use: Never     Iola Lukes, FNP 01/31/24 1146  "

## 2024-01-31 NOTE — ED Triage Notes (Signed)
 Patient reports that she slipped coming out of the shower this AM, falling and hitting her right rib cage on the toilet. Patient states it hurts to take a deep breath and to move her right arm.  Patient states she took Ibuprofen  800 mg an hour ago.

## 2024-01-31 NOTE — Discharge Instructions (Addendum)
 You were seen today for pain and bruising to the right side of your back after a fall. Your exam and X-rays did not show any broken ribs, lung injury, or other serious problems. This injury is a soft tissue contusion, which can be quite painful but is not dangerous and will heal with time.  You received a Toradol  injection today to help with pain and inflammation. You may use ice on the sore area today to help decrease swelling and pain. Starting tomorrow, you may alternate ice and heat as needed, as ice helps with inflammation and heat helps relax tight muscles. Take the prescribed Toradol  every six hours as needed for pain. While taking this medication, do not use over-the-counter anti-inflammatories such as aspirin, Motrin , ibuprofen , or Aleve, as this may increase the risk of side effects. If needed, you may take Tylenol  (acetaminophen ) 1000 mg every six hours for additional pain relief. This equals two 500 mg tablets at a time. Be careful not to take more than 4000 mg of Tylenol  in a 24-hour period. A small number of oxycodone  tablets were prescribed for severe pain that is not relieved by Toradol  or other comfort measures; use this only if absolutely necessary. Pain may be more intense over the next day or so and may require more frequent medication at first, then should gradually improve. Rest and avoid heavy lifting or strenuous activity while healing. Most people notice significant improvement within a few days, though mild soreness can last one to two weeks.  Follow up with your primary care provider if pain is not improving as expected, if you continue to need strong pain medication after several days, or if you have concerns about returning to normal activity. Seek emergency care right away if you develop trouble breathing, chest pain, increasing or severe back pain, abdominal pain, dizziness, weakness, numbness, fever, or any new or worsening symptoms that concern you.
